# Patient Record
Sex: Female | Born: 1965 | Race: White | Hispanic: No | Marital: Married | Smoking: Current every day smoker
Health system: Southern US, Community
[De-identification: ages and names within clinical notes are randomized; demographics above are authoritative.]

## PROBLEM LIST (undated history)

## (undated) DIAGNOSIS — F319 Bipolar disorder, unspecified: Secondary | ICD-10-CM

## (undated) DIAGNOSIS — F410 Panic disorder [episodic paroxysmal anxiety] without agoraphobia: Secondary | ICD-10-CM

## (undated) DIAGNOSIS — G4700 Insomnia, unspecified: Secondary | ICD-10-CM

## (undated) DIAGNOSIS — K229 Disease of esophagus, unspecified: Secondary | ICD-10-CM

## (undated) DIAGNOSIS — F32A Depression, unspecified: Secondary | ICD-10-CM

## (undated) DIAGNOSIS — F329 Major depressive disorder, single episode, unspecified: Secondary | ICD-10-CM

## (undated) DIAGNOSIS — F4481 Dissociative identity disorder: Secondary | ICD-10-CM

## (undated) DIAGNOSIS — G43909 Migraine, unspecified, not intractable, without status migrainosus: Secondary | ICD-10-CM

## (undated) DIAGNOSIS — I10 Essential (primary) hypertension: Secondary | ICD-10-CM

## (undated) DIAGNOSIS — E079 Disorder of thyroid, unspecified: Secondary | ICD-10-CM

## (undated) DIAGNOSIS — N83209 Unspecified ovarian cyst, unspecified side: Secondary | ICD-10-CM

## (undated) HISTORY — PX: CHOLECYSTECTOMY: SHX55

## (undated) HISTORY — PX: OTHER SURGICAL HISTORY: SHX169

---

## 2016-02-10 ENCOUNTER — Emergency Department (HOSPITAL_COMMUNITY): Payer: Self-pay

## 2016-02-10 ENCOUNTER — Encounter (HOSPITAL_COMMUNITY): Payer: Self-pay | Admitting: Emergency Medicine

## 2016-02-10 ENCOUNTER — Emergency Department (HOSPITAL_COMMUNITY)
Admission: EM | Admit: 2016-02-10 | Discharge: 2016-02-11 | Disposition: A | Discharge status: Home / Self Care | Payer: Self-pay | Attending: Emergency Medicine | Admitting: Emergency Medicine

## 2016-02-10 DIAGNOSIS — K5900 Constipation, unspecified: Secondary | ICD-10-CM | POA: Insufficient documentation

## 2016-02-10 DIAGNOSIS — R110 Nausea: Secondary | ICD-10-CM | POA: Insufficient documentation

## 2016-02-10 DIAGNOSIS — Z79899 Other long term (current) drug therapy: Secondary | ICD-10-CM | POA: Insufficient documentation

## 2016-02-10 DIAGNOSIS — F17210 Nicotine dependence, cigarettes, uncomplicated: Secondary | ICD-10-CM | POA: Insufficient documentation

## 2016-02-10 DIAGNOSIS — R1032 Left lower quadrant pain: Secondary | ICD-10-CM | POA: Insufficient documentation

## 2016-02-10 HISTORY — DX: Unspecified ovarian cyst, unspecified side: N83.209

## 2016-02-10 HISTORY — DX: Disease of esophagus, unspecified: K22.9

## 2016-02-10 LAB — CBC WITH DIFFERENTIAL/PLATELET
BASOS ABS: 0 10*3/uL (ref 0.0–0.1)
Basophils Relative: 0 %
EOS PCT: 3 %
Eosinophils Absolute: 0.2 10*3/uL (ref 0.0–0.7)
HCT: 37.3 % (ref 36.0–46.0)
Hemoglobin: 12.8 g/dL (ref 12.0–15.0)
LYMPHS PCT: 36 %
Lymphs Abs: 2 10*3/uL (ref 0.7–4.0)
MCH: 31.9 pg (ref 26.0–34.0)
MCHC: 34.3 g/dL (ref 30.0–36.0)
MCV: 93 fL (ref 78.0–100.0)
MONO ABS: 0.4 10*3/uL (ref 0.1–1.0)
Monocytes Relative: 8 %
Neutro Abs: 3 10*3/uL (ref 1.7–7.7)
Neutrophils Relative %: 53 %
PLATELETS: 176 10*3/uL (ref 150–400)
RBC: 4.01 MIL/uL (ref 3.87–5.11)
RDW: 13 % (ref 11.5–15.5)
WBC: 5.7 10*3/uL (ref 4.0–10.5)

## 2016-02-10 LAB — COMPREHENSIVE METABOLIC PANEL
ALBUMIN: 3.6 g/dL (ref 3.5–5.0)
ALK PHOS: 47 U/L (ref 38–126)
ALT: 19 U/L (ref 14–54)
AST: 21 U/L (ref 15–41)
Anion gap: 6 (ref 5–15)
BILIRUBIN TOTAL: 0.5 mg/dL (ref 0.3–1.2)
BUN: 7 mg/dL (ref 6–20)
CO2: 28 mmol/L (ref 22–32)
Calcium: 8.7 mg/dL — ABNORMAL LOW (ref 8.9–10.3)
Chloride: 104 mmol/L (ref 101–111)
Creatinine, Ser: 0.59 mg/dL (ref 0.44–1.00)
GFR calc Af Amer: >60 mL/min (ref >60)
GFR calc non Af Amer: >60 mL/min (ref >60)
GLUCOSE: 122 mg/dL — AB (ref 65–99)
Potassium: 3.5 mmol/L (ref 3.5–5.1)
Sodium: 138 mmol/L (ref 135–145)
TOTAL PROTEIN: 6.3 g/dL — AB (ref 6.5–8.1)

## 2016-02-10 LAB — LIPASE, BLOOD: Lipase: 27 U/L (ref 11–51)

## 2016-02-10 LAB — PREGNANCY, URINE: Preg Test, Ur: NEGATIVE

## 2016-02-10 MED ORDER — POLYETHYLENE GLYCOL 3350 17 G PO PACK: 17.0000 g | PACK | Freq: Two times a day (BID) | ORAL | 0 refills | Status: DC

## 2016-02-10 MED ORDER — BISACODYL 10 MG RE SUPP: 10.0000 mg | Freq: Once | RECTAL | Status: DC

## 2016-02-10 NOTE — ED Triage Notes (Signed)
Pt reports intermittent abdominal pain and constipation that has been intermittent for 3 weeks. Pt reports BM yesterday but states it was not normal. Pt also reports LE cramping.

## 2016-02-10 NOTE — Discharge Instructions (Signed)
You were given an enema today. Increase your water intake and fiber (fruits, vegetables, whole wheat). Start using Miralax at least twice daily.

## 2016-02-10 NOTE — ED Provider Notes (Signed)
[facility] Provider Note   CSN: [id number] Arrival date & time: 02/10/16  2011     History   Chief Complaint Chief Complaint  Patient presents with  . Constipation    HPI Hayley Buckley is a 50 y.o. female.  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  Patient notes severe abdominal pain around her rib cage/epigastric region that started 3 weeks ago. Pain is sharp and burning. Pain is getting progressively worse, 8/10 currently. Pain worsened with eating and getting upset.  She intermittently has nausea over the last 2 weeks with smells. No vomiting. She denies nausea today. Over the last 3 weeks, she goes 2-3 days without a BM, this is unusual as she had a cholecystectomy at the age of 50 so she tends to have soft stools.  Her last BM was yesterday and was loose, prior to this it was hard. No blood in the stools. She has not had any flatulence today.  She has hot flashes. Denies fevers or chills.   Also notes cramps in her LE bilaterally for the last 2 days. Notes cramps start in her feet and work their way up into her calves and knees. She used to have issues with her potassium. No injury. No h/o swelling, warmth, or erythema.    Past Medical History:  Diagnosis Date  . Esophageal abnormality   . Ovarian cyst     There are no active problems to display for this patient.   Past Surgical History:  Procedure Laterality Date  . CHOLECYSTECTOMY    . gastrectomy tube      OB History    Gravida Para Term Preterm AB Living             0   SAB TAB Ectopic Multiple Live Births                   Home Medications    Prior to Admission medications   Medication Sig Start Date End Date Taking? Authorizing Provider  acetaminophen (TYLENOL) 500 MG tablet Take 1,000  mg by mouth every 6 (six) hours as needed for mild pain.   Yes Historical Provider, MD  polyethylene glycol (MIRALAX) packet Take 17 g by mouth 2 (two) times daily. 02/10/16   Joanna Puffrystal S Dorsey, MD    Family History Family History  Problem Relation Age of Onset  . Alzheimer's disease Mother   . Diabetes Mother   . Lung disease Father   . Heart attack Brother   . Diabetes Other   . Heart attack Other   . Cancer Other   . ALS Other     Social History Social History  Substance Use Topics  . Smoking status: Current Every Day Smoker    Packs/day: 0.50    Types: Cigarettes  . Smokeless tobacco: Never Used  . Alcohol use No     Allergies   Patient has no known allergies.   Review of Systems Review of Systems  Constitutional: Positive for appetite change. Negative for activity change, diaphoresis, fatigue and fever.  HENT: Negative.   Eyes: Negative.   Respiratory: Negative for cough, shortness of breath, wheezing and stridor.   Cardiovascular: Negative for chest pain and palpitations.  Gastrointestinal: Positive for abdominal pain, constipation and nausea. Negative for abdominal  distention, anal bleeding, blood in stool, diarrhea and vomiting.  Endocrine: Negative.   Genitourinary: Negative for decreased urine volume, difficulty urinating, dysuria, frequency and pelvic pain.  Musculoskeletal: Negative for back pain, gait problem and joint swelling.  Skin: Negative for rash.  Allergic/Immunologic: Negative.   Neurological: Negative.   Hematological: Negative.   Psychiatric/Behavioral: Negative.      Physical Exam Updated Vital Signs BP 134/75 (BP Location: Left Arm)   Pulse 76   Temp 99.2 F (37.3 C) (Oral)   Resp 16   Ht 5\' 4"  (1.626 m)   Wt 75.5 kg   LMP 02/09/2016 Comment: Negative U-preg in ED 02/10/2016  SpO2 99%   BMI 28.58 kg/m   Physical Exam  Constitutional: She appears well-developed and well-nourished. No distress.  HENT:  Head: Normocephalic and  atraumatic.  Nose: Nose normal.  Mouth/Throat: Oropharynx is clear and moist. No oropharyngeal exudate.  Eyes: Conjunctivae are normal. Right eye exhibits no discharge. Left eye exhibits no discharge. No scleral icterus.  Neck: Normal range of motion. Neck supple.  Cardiovascular: Normal rate, regular rhythm, normal heart sounds and intact distal pulses.  Exam reveals no gallop and no friction rub.   No murmur heard. Pulmonary/Chest: Effort normal and breath sounds normal. No respiratory distress. She has no wheezes. She has no rales. She exhibits no tenderness.  Abdominal: Soft. Bowel sounds are normal. She exhibits no distension and no mass. There is tenderness. There is no rebound and no guarding. No hernia.  Surgical scare in the epigastric region. Mild tenderness in the LLQ and the epigastric region. No rebound or guarding.  Musculoskeletal: Normal range of motion. She exhibits no edema or deformity.  Neurological: She is alert. She exhibits normal muscle tone.  Skin: Skin is warm. Capillary refill takes less than 2 seconds. No rash noted. She is not diaphoretic.  Psychiatric: She has a normal mood and affect. Her behavior is normal.     ED Treatments / Results  Labs (all labs ordered are listed, but only abnormal results are displayed) Labs Reviewed  COMPREHENSIVE METABOLIC PANEL - Abnormal; Notable for the following:       Result Value   Glucose, Bld 122 (*)    Calcium 8.7 (*)    Total Protein 6.3 (*)    All other components within normal limits  CBC WITH DIFFERENTIAL/PLATELET  LIPASE, BLOOD  PREGNANCY, URINE    EKG  EKG Interpretation None       Radiology Dg Abdomen 1 View  Result Date: 02/10/2016 CLINICAL DATA:  Constipation and epigastric pain EXAM: ABDOMEN - 1 VIEW COMPARISON:  None. FINDINGS: The bowel gas pattern is normal. No radio-opaque calculi or other significant radiographic abnormality are seen. There is stool seen throughout the colon. Right upper  quadrant cholecystectomy clips. IMPRESSION: No radiographic evidence of obstruction. Electronically Signed   By: Deatra Robinson M.D.   On: 02/10/2016 22:54    Procedures Procedures (including critical care time)  Medications Ordered in ED Medications - No data to display   Initial Impression / Assessment and Plan / ED Course  I have reviewed the triage vital signs and the nursing notes.  Pertinent labs & imaging results that were available during my care of the patient were reviewed by me and considered in my medical decision making (see chart for details).  Clinical Course    Exam consistent with constipation, however given the patient's h/o abdominal surgeries, would like to r/o SBO. Abdominal exam non-surgical.  H/o not consistent with  diverticulitis. Will get CMET, CBC, and lipase. Will also get a KUB.  Labs normal. KUB consistent with constipation. Will give a soap suds enema. Discussed dietary changes at home (increased fluid intake and increased fiber) in addition to Miralax. Return precautions discussed.  Final Clinical Impressions(s) / ED Diagnoses   Final diagnoses:  Constipation, unspecified constipation type    New Prescriptions New Prescriptions   POLYETHYLENE GLYCOL (MIRALAX) PACKET    Take 17 g by mouth 2 (two) times daily.     Joanna Puffrystal S Dorsey, MD 02/10/16 2314    Lavera Guiseana Duo Liu, MD 02/11/16 [phone]

## 2016-02-11 NOTE — ED Notes (Signed)
Pt states that she got relief from the sse and she feels good to go home

## 2016-02-27 ENCOUNTER — Observation Stay (HOSPITAL_COMMUNITY): Payer: Self-pay

## 2016-02-27 ENCOUNTER — Encounter (HOSPITAL_COMMUNITY): Payer: Self-pay

## 2016-02-27 ENCOUNTER — Emergency Department (HOSPITAL_COMMUNITY): Payer: Self-pay

## 2016-02-27 ENCOUNTER — Observation Stay (HOSPITAL_COMMUNITY)
Admission: EM | Admit: 2016-02-27 | Discharge: 2016-02-27 | Disposition: A | Discharge status: Home / Self Care | Payer: Self-pay | Attending: Internal Medicine | Admitting: Internal Medicine

## 2016-02-27 ENCOUNTER — Observation Stay (HOSPITAL_BASED_OUTPATIENT_CLINIC_OR_DEPARTMENT_OTHER): Payer: Self-pay

## 2016-02-27 DIAGNOSIS — R0789 Other chest pain: Principal | ICD-10-CM | POA: Insufficient documentation

## 2016-02-27 DIAGNOSIS — R202 Paresthesia of skin: Secondary | ICD-10-CM

## 2016-02-27 DIAGNOSIS — R200 Anesthesia of skin: Secondary | ICD-10-CM | POA: Insufficient documentation

## 2016-02-27 DIAGNOSIS — R079 Chest pain, unspecified: Secondary | ICD-10-CM | POA: Diagnosis present

## 2016-02-27 DIAGNOSIS — R071 Chest pain on breathing: Secondary | ICD-10-CM

## 2016-02-27 DIAGNOSIS — F17210 Nicotine dependence, cigarettes, uncomplicated: Secondary | ICD-10-CM | POA: Insufficient documentation

## 2016-02-27 LAB — CBC
HCT: 40.3 % (ref 36.0–46.0)
HEMATOCRIT: 36.7 % (ref 36.0–46.0)
HEMOGLOBIN: 12.9 g/dL (ref 12.0–15.0)
Hemoglobin: 13.7 g/dL (ref 12.0–15.0)
MCH: 31.5 pg (ref 26.0–34.0)
MCH: 33 pg (ref 26.0–34.0)
MCHC: 34 g/dL (ref 30.0–36.0)
MCHC: 35.1 g/dL (ref 30.0–36.0)
MCV: 92.6 fL (ref 78.0–100.0)
MCV: 93.9 fL (ref 78.0–100.0)
Platelets: 219 10*3/uL (ref 150–400)
Platelets: 179 10*3/uL (ref 150–400)
RBC: 4.35 MIL/uL (ref 3.87–5.11)
RBC: 3.91 MIL/uL (ref 3.87–5.11)
RDW: 13 % (ref 11.5–15.5)
RDW: 13.2 % (ref 11.5–15.5)
WBC: 7.9 10*3/uL (ref 4.0–10.5)
WBC: 6.7 10*3/uL (ref 4.0–10.5)

## 2016-02-27 LAB — BASIC METABOLIC PANEL
ANION GAP: 5 (ref 5–15)
BUN: 9 mg/dL (ref 6–20)
CALCIUM: 8.7 mg/dL — AB (ref 8.9–10.3)
CO2: 27 mmol/L (ref 22–32)
Chloride: 103 mmol/L (ref 101–111)
Creatinine, Ser: 0.58 mg/dL (ref 0.44–1.00)
GFR calc Af Amer: >60 mL/min (ref >60)
GLUCOSE: 161 mg/dL — AB (ref 65–99)
Potassium: 3.7 mmol/L (ref 3.5–5.1)
SODIUM: 135 mmol/L (ref 135–145)

## 2016-02-27 LAB — POC URINE PREG, ED: Preg Test, Ur: NEGATIVE

## 2016-02-27 LAB — CREATININE, SERUM: CREATININE: 0.61 mg/dL (ref 0.44–1.00)

## 2016-02-27 LAB — I-STAT TROPONIN, ED: TROPONIN I, POC: 0 ng/mL (ref 0.00–0.08)

## 2016-02-27 LAB — TROPONIN I

## 2016-02-27 LAB — BRAIN NATRIURETIC PEPTIDE: B Natriuretic Peptide: 20 pg/mL (ref 0.0–100.0)

## 2016-02-27 MED ORDER — ASPIRIN EC 325 MG PO TBEC
325.0000 mg | DELAYED_RELEASE_TABLET | Freq: Every day | ORAL | Status: DC
  Administered 2016-02-27: 325 mg via ORAL
  Filled 2016-02-27: qty 1

## 2016-02-27 MED ORDER — ONDANSETRON HCL 4 MG/2ML IJ SOLN: 4.0000 mg | Freq: Four times a day (QID) | INTRAMUSCULAR | Status: DC | PRN

## 2016-02-27 MED ORDER — NITROGLYCERIN 0.4 MG SL SUBL
0.4000 mg | SUBLINGUAL_TABLET | SUBLINGUAL | Status: DC | PRN
  Administered 2016-02-27 (×2): 0.4 mg via SUBLINGUAL
  Filled 2016-02-27 (×2): qty 1

## 2016-02-27 MED ORDER — ACETAMINOPHEN 325 MG PO TABS: 650.0000 mg | ORAL_TABLET | ORAL | Status: DC | PRN

## 2016-02-27 MED ORDER — ENOXAPARIN SODIUM 40 MG/0.4ML ~~LOC~~ SOLN
40.0000 mg | SUBCUTANEOUS | Status: DC
  Administered 2016-02-27: 40 mg via SUBCUTANEOUS
  Filled 2016-02-27: qty 0.4

## 2016-02-27 MED ORDER — TRAMADOL HCL 50 MG PO TABS
50.0000 mg | ORAL_TABLET | Freq: Three times a day (TID) | ORAL | Status: DC | PRN
  Filled 2016-02-27: qty 1

## 2016-02-27 MED ORDER — SODIUM CHLORIDE 0.9 % IV BOLUS (SEPSIS)
1000.0000 mL | Freq: Once | INTRAVENOUS | Status: AC
  Administered 2016-02-27: 1000 mL via INTRAVENOUS

## 2016-02-27 MED ORDER — PNEUMOCOCCAL VAC POLYVALENT 25 MCG/0.5ML IJ INJ: 0.5000 mL | INJECTION | INTRAMUSCULAR | Status: DC

## 2016-02-27 MED ORDER — IOPAMIDOL (ISOVUE-370) INJECTION 76%
100.0000 mL | Freq: Once | INTRAVENOUS | Status: AC | PRN
  Administered 2016-02-27: 100 mL via INTRAVENOUS

## 2016-02-27 NOTE — ED Triage Notes (Signed)
Pt in by ems for chest pain onset tonight while lying in the bed.  Pt was given 324 baby aspirin and 1 ntg sl with some relief of pain

## 2016-02-27 NOTE — Progress Notes (Signed)
Pt stated that she wanted to leave AMA. Stated that she was having a lot of stress and that was what was causing her chest pain. Advised patient of risks of leaving AMA and patient verbalized understanding. IV's d/c'd x 2 sites. AMA papers signed by patient.

## 2016-02-27 NOTE — Progress Notes (Signed)
*  PRELIMINARY RESULTS* Echocardiogram 2D Echocardiogram has been performed.  Stacey DrainWhite, Merinda Victorino J 02/27/2016, 4:56 PM

## 2016-02-27 NOTE — ED Notes (Signed)
Patient transported to CT 

## 2016-02-27 NOTE — ED Provider Notes (Signed)
[facility] Provider Note   CSN: [id number] Arrival date & time: 02/27/16  0444     History   Chief Complaint Chief Complaint  Patient presents with  . Chest Pain    HPI Hayley Buckley is a 50 y.o. female.  HPI  50 year old female presents with chest pain that started around 3:30 AM. She states she was already awake and on the couch trying to lay back down when the pain started. It was about an 8 or 9/10 when it started. Patient states that 2 days ago she started having right arm numbness like her arm was asleep. No weakness or dropping objects. About 30 minutes before the chest pain started she then developed left arm numbness. Again no weakness. Now having chest tightness and pressure with inspiration. Wraps around bilaterally to her back. Has also been having a couple days of bilateral leg swelling and pain. No shortness of breath or diaphoresis. Was nauseated when the pain started. The only thing that makes it worse is deep inspiration. Patient was given 324 mg aspirin and one nitroglycerin with partial relief. States she has significant improvement but still rates it as a 6/10. She is concerned about stroke due to the numbness. No neck pain but has been told she has cervical disc disease. Has a moderate frontal headache that started with the chest pain tonight. Has a history of smoking use but denies drug abuse, hypertension, diabetes, or known coronary disease. Multiple family members with coronary disease, earliest onset was a brother with a diagnosis at age 48 or 9259.  Past Medical History:  Diagnosis Date  . Esophageal abnormality   . Ovarian cyst     Patient Active Problem List   Diagnosis Date Noted  . Chest pain 02/27/2016    Past Surgical History:  Procedure Laterality Date  . CHOLECYSTECTOMY    . gastrectomy tube      OB History    Gravida Para Term Preterm AB Living             0   SAB TAB Ectopic Multiple Live Births                   Home  Medications    Prior to Admission medications   Medication Sig Start Date End Date Taking? Authorizing Provider  acetaminophen (TYLENOL) 500 MG tablet Take 1,000 mg by mouth every 6 (six) hours as needed for mild pain.    Historical Provider, MD  polyethylene glycol (MIRALAX) packet Take 17 g by mouth 2 (two) times daily. 02/10/16   Joanna Puffrystal S Dorsey, MD    Family History Family History  Problem Relation Age of Onset  . Alzheimer's disease Mother   . Diabetes Mother   . Lung disease Father   . Heart attack Brother   . Diabetes Other   . Heart attack Other   . Cancer Other   . ALS Other     Social History Social History  Substance Use Topics  . Smoking status: Current Every Day Smoker    Packs/day: 0.50    Types: Cigarettes  . Smokeless tobacco: Never Used  . Alcohol use No     Allergies   Patient has no known allergies.   Review of Systems Review of Systems  Constitutional: Negative for diaphoresis.  Respiratory: Negative for shortness of breath.   Cardiovascular: Positive for chest pain.  Gastrointestinal: Positive for nausea. Negative for abdominal pain and vomiting.  Musculoskeletal: Positive for back pain.  Neurological: Positive  for numbness and headaches. Negative for weakness.  All other systems reviewed and are negative.    Physical Exam Updated Vital Signs BP 134/76   Pulse 74   Temp 98 F (36.7 C) (Oral)   Resp 18   Ht 5\' 4"  (1.626 m)   Wt 168 lb (76.2 kg)   LMP 02/09/2016 Comment: Negative U-preg in ED 02/10/2016  SpO2 97%   BMI 28.84 kg/m   Physical Exam  Constitutional: She is oriented to person, place, and time. She appears well-developed and well-nourished. No distress.  HENT:  Head: Normocephalic and atraumatic.  Right Ear: External ear normal.  Left Ear: External ear normal.  Nose: Nose normal.  Eyes: Right eye exhibits no discharge. Left eye exhibits no discharge.  Neck: Neck supple.  Cardiovascular: Normal rate, regular rhythm  and normal heart sounds.   Pulses:      Radial pulses are 2+ on the right side, and 2+ on the left side.  Pulmonary/Chest: Effort normal and breath sounds normal. She exhibits tenderness (left anterior inferior chest).  Abdominal: Soft. There is no tenderness.  Musculoskeletal: She exhibits no edema or tenderness.  Neurological: She is alert and oriented to person, place, and time.  5/5 strength in all 4 extremities. Decreased subjective sensation in bilateral hands/wrists  Skin: Skin is warm and dry. She is not diaphoretic.  Nursing note and vitals reviewed.    ED Treatments / Results  Labs (all labs ordered are listed, but only abnormal results are displayed) Labs Reviewed  BASIC METABOLIC PANEL - Abnormal; Notable for the following:       Result Value   Glucose, Bld 161 (*)    Calcium 8.7 (*)    All other components within normal limits  CBC  BRAIN NATRIURETIC PEPTIDE  TROPONIN I  TROPONIN I  TROPONIN I  I-STAT TROPOININ, ED  POC URINE PREG, ED    EKG  EKG Interpretation  Date/Time:  Friday February 27 2016 04:53:50 EST Ventricular Rate:  73 PR Interval:    QRS Duration: 84 QT Interval:  400 QTC Calculation: 441 R Axis:   78 Text Interpretation:  Normal sinus rhythm Short PR interval Low voltage, precordial leads no acute ST/T changes No old tracing to compare Confirmed by Joane Postel MD, Moncerrat Burnstein [phone]) on 02/27/2016 4:57:48 AM       Radiology Dg Chest 2 View  Result Date: 02/27/2016 CLINICAL DATA:  Chest pain and tightness. EXAM: CHEST  2 VIEW COMPARISON:  None. FINDINGS: The cardiomediastinal contours are normal. The lungs are clear. Pulmonary vasculature is normal. No consolidation, pleural effusion, or pneumothorax. The sternum is not visualized. No acute osseous abnormalities are seen. There are remote right rib fractures. IMPRESSION: No active cardiopulmonary disease. Electronically Signed   By: Rubye Oaks M.D.   On: 02/27/2016 05:40   Ct Head Wo  Contrast  Result Date: 02/27/2016 CLINICAL DATA:  Posterior headache.  This began earlier today. EXAM: CT HEAD WITHOUT CONTRAST TECHNIQUE: Contiguous axial images were obtained from the base of the skull through the vertex without intravenous contrast. COMPARISON:  None. FINDINGS: Brain: There is asymmetric hypoattenuation of the LEFT temporal lobe which appears on multiple axial slices, reference image 7, 8, 9 series 2. I suspect is artifactual, but I cannot explain why there is such marked asymmetry with regard to the normal-appearing RIGHT temporal lobe. In the setting of acute headache, MRI brain without and with contrast is recommended for further evaluation. No hemorrhage, mass lesion, hydrocephalus, or extra-axial fluid. Vascular:  No hyperdense vessel or unexpected calcification. Skull: Normal. Negative for fracture or focal lesion. Sinuses/Orbits: No acute finding. Other: None. IMPRESSION: Asymmetric hypoattenuation of the LEFT temporal lobe which is probably artifactual, but cannot completely be dismissed in the setting of acute headache. Vascular, inflammatory, or neoplastic considerations could all have this appearance. MRI brain without and with contrast recommended for further evaluation. Electronically Signed   By: Elsie StainJohn T Curnes M.D.   On: 02/27/2016 07:07   Ct Angio Chest/abd/pel For Dissection W And/or Wo Contrast  Result Date: 02/27/2016 CLINICAL DATA:  LEFT anterior chest pain radiates to the back. This began earlier today. EXAM: CT ANGIOGRAPHY CHEST, ABDOMEN AND PELVIS TECHNIQUE: Multidetector CT imaging through the chest, abdomen and pelvis was performed using the standard protocol during bolus administration of intravenous contrast. Multiplanar reconstructed images and MIPs were obtained and reviewed to evaluate the vascular anatomy. CONTRAST:  Isovue 370, 100 mL. COMPARISON:  Chest radiograph 02/27/2016 FINDINGS: CTA CHEST FINDINGS Cardiovascular: Preferential opacification of the  thoracic aorta. No evidence of thoracic aortic aneurysm or dissection. Normal heart size. No pericardial effusion. Mediastinum/Nodes: No enlarged mediastinal, hilar, or axillary lymph nodes. Thyroid gland, trachea, and esophagus demonstrate no significant findings. Lungs/Pleura: Lungs are clear. No pleural effusion or pneumothorax. Musculoskeletal: No chest wall abnormality. No acute or significant osseous findings. Review of the MIP images confirms the above findings. CTA ABDOMEN AND PELVIS FINDINGS VASCULAR Aorta: Normal caliber aorta without aneurysm, dissection, vasculitis or significant stenosis. Celiac: Patent without evidence of aneurysm, dissection, vasculitis or significant stenosis. SMA: Patent without evidence of aneurysm, dissection, vasculitis or significant stenosis. Renals: Both renal arteries are patent without evidence of aneurysm, dissection, vasculitis, fibromuscular dysplasia or significant stenosis. IMA: Patent without evidence of aneurysm, dissection, vasculitis or significant stenosis. Inflow: Normal caliber aorta without aneurysm, dissection, vasculitis or significant stenosis. Veins: No obvious venous abnormality within the limitations of this arterial phase study. Review of the MIP images confirms the above findings. NON-VASCULAR Hepatobiliary: Steatosis.  Cholecystectomy.  No masses. Pancreas: Unremarkable. No pancreatic ductal dilatation or surrounding inflammatory changes. Spleen: Mild splenomegaly is suggested.  Small splenule is noted. Adrenals/Urinary Tract: Adrenal glands are unremarkable. Kidneys are normal, without renal calculi, focal lesion, or hydronephrosis. Bladder is unremarkable. Stomach/Bowel: Stomach is within normal limits. Appendix appears normal. No evidence of bowel wall thickening, distention, or inflammatory changes. Lymphatic: No significant vascular findings are present. No enlarged abdominal or pelvic lymph nodes. Reproductive: Uterus and bilateral adnexa are  unremarkable. Other: No abdominal wall hernia or ascites. Musculoskeletal: Spondylosis.  No worrisome osseous lesion. Review of the MIP images confirms the above findings. IMPRESSION: No evidence for aortic or arterial dissection. No acute findings in the chest or abdomen. Electronically Signed   By: Elsie StainJohn T Curnes M.D.   On: 02/27/2016 07:01    Procedures Procedures (including critical care time)  Medications Ordered in ED Medications  nitroGLYCERIN (NITROSTAT) SL tablet 0.4 mg (0.4 mg Sublingual Given 02/27/16 0737)  sodium chloride 0.9 % bolus 1,000 mL (1,000 mLs Intravenous New Bag/Given 02/27/16 0559)  iopamidol (ISOVUE-370) 76 % injection 100 mL (100 mLs Intravenous Contrast Given 02/27/16 0618)     Initial Impression / Assessment and Plan / ED Course  I have reviewed the triage vital signs and the nursing notes.  Pertinent labs & imaging results that were available during my care of the patient were reviewed by me and considered in my medical decision making (see chart for details).  Clinical Course as of Feb 27 847  Fri Feb 27, 2016  0513 Nitro, labs. CT head, CT chest to rule out dissection given the numbness and back pain. Also consider PE given her reported leg swelling and pleuritic pain. However with the numbness and possibility of severe disease will rule out aorta.  [SG]  [id number] Dr Elisabeth Pigeon to admit, requests MRI and tele obs admission.  [SG]    Clinical Course User Index [SG] Pricilla Loveless, MD    CTA without dissection or PE. CT head with possible artifact vs CVA. Given her numbness started and is more severe on right with left temporal findings, will get MRI. Admit for tele obs for her CP  Final Clinical Impressions(s) / ED Diagnoses   Final diagnoses:  Nonspecific chest pain  Arm numbness    New Prescriptions New Prescriptions   No medications on file     Pricilla Loveless, MD 02/27/16 [phone]

## 2016-02-27 NOTE — H&P (Signed)
History and Physical    Hayley Buckley ZOX:[id number] DOB: March 14, 1965 DOA: 02/27/2016  Referring MD/NP/PA: Dr. Pricilla LovelessGoldston Scott  PCP: No PCP Per Patient   Patient coming from: Home  Chief Complaint: Chest pain and right arm numbness  HPI: Hayley Buckley is a 50 y.o. female with no significant past medical history. She presented with sudden onset midsternal chest pain started around 3:30 a.m. prior to this admission. Pain was initially 8 out of 10 in intensity, non-radiating, present at rest and with movement. Pain was sharp. No specific alleviating or aggravating factors at home but when she was in ER she was given aspirin and 1 nitroglycerin with partial chest pain relief. Her pain was about 6 out of 10 after those medications were given in ED. No associated palpitations. No cough. No lightheadedness. She does report feeling that her legs are swollen especially around ankles. She also reported having left arm numbness followed by right arm numbness over past 2 days prior to this admission but no associated weakness. No slurred speech. No dizziness. No reports of fevers or chills. No reports of abdominal pain, nausea or vomiting.  ED Course: In ED, patient is hemodynamically stable. Her blood work is relatively unremarkable. Troponin level was within normal limits. Chest x-ray with no acute findings. CT chest with no acute cardiopulmonary findings. CT head showed likely and artifact in left temporal lobe but because of patient's report of right arm numbness MRI of the brain was ordered to rule out the possibility of CVA.    Review of Systems:  Constitutional: Negative for fever, chills, diaphoresis, activity change, appetite change and fatigue.  HENT: Negative for ear pain, nosebleeds, congestion, facial swelling, rhinorrhea, neck pain, neck stiffness and ear discharge.   Eyes: Negative for pain, discharge, redness, itching and visual disturbance.  Respiratory: Negative for cough, choking, chest  tightness, shortness of breath, wheezing and stridor.   Cardiovascular: Positive for chest pain but no palpitations. Reports leg swelling. Gastrointestinal: Negative for abdominal distention.  Genitourinary: Negative for dysuria, urgency, frequency, hematuria, flank pain, decreased urine volume, difficulty urinating and dyspareunia.  Musculoskeletal: Negative for back pain, joint swelling, arthralgias and gait problem.  Neurological: Negative for dizziness, tremors, seizures, syncope, facial asymmetry, speech difficulty, weakness, light-headedness. Positive for right arm numbness and headaches.  Hematological: Negative for adenopathy. Does not bruise/bleed easily.  Psychiatric/Behavioral: Negative for hallucinations, behavioral problems, confusion, dysphoric mood, decreased concentration and agitation.   Past Medical History:  Diagnosis Date  . Esophageal abnormality   . Ovarian cyst     Past Surgical History:  Procedure Laterality Date  . CHOLECYSTECTOMY    . gastrectomy tube      Social history:  reports that she has been smoking Cigarettes.  She has been smoking about 0.50 packs per day. She has never used smokeless tobacco. She reports that she does not drink alcohol or use drugs.  Ambulation: Ambulates without assistance at baseline.  No Known Allergies  Family History  Problem Relation Age of Onset  . Alzheimer's disease Mother   . Diabetes Mother   . Lung disease Father   . Heart attack Brother   . Diabetes Other   . Heart attack Other   . Cancer Other   . ALS Other     Prior to Admission medications   Medication Sig Start Date End Date Taking? Authorizing Provider  acetaminophen (TYLENOL) 500 MG tablet Take 1,000 mg by mouth every 6 (six) hours as needed for mild pain.    Historical Provider,  MD  polyethylene glycol (MIRALAX) packet Take 17 g by mouth 2 (two) times daily. 02/10/16   Joanna Puff, MD    Physical Exam: Vitals:   02/27/16 0600 02/27/16 0630  02/27/16 0700 02/27/16 0730  BP: 113/65 135/82 114/68 134/76  Pulse: 72 85 76 74  Resp: 18  17 18   Temp:      TempSrc:      SpO2: 97% 99% 96% 97%  Weight:      Height:        Constitutional: NAD, calm, comfortable Vitals:   02/27/16 0600 02/27/16 0630 02/27/16 0700 02/27/16 0730  BP: 113/65 135/82 114/68 134/76  Pulse: 72 85 76 74  Resp: 18  17 18   Temp:      TempSrc:      SpO2: 97% 99% 96% 97%  Weight:      Height:       Eyes: PERRL, lids and conjunctivae normal ENMT: Mucous membranes are moist. Posterior pharynx clear of any exudate or lesions.Normal dentition.  Neck: normal, supple, no masses, no thyromegaly Respiratory: clear to auscultation bilaterally, no wheezing, no crackles. Normal respiratory effort. No accessory muscle use.  Cardiovascular: Regular rate and rhythm, no murmurs / rubs / gallops. No extremity edema. 2+ pedal pulses. No carotid bruits.  Abdomen: no tenderness, no masses palpated. No hepatosplenomegaly. Bowel sounds positive.  Musculoskeletal: no clubbing / cyanosis. No joint deformity upper and lower extremities. Good ROM, no contractures. Normal muscle tone.  Skin: no rashes, lesions, ulcers. No induration Neurologic: CN 2-12 grossly intact. Sensation intact, DTR normal. Strength 5/5 in all 4.  Psychiatric: Normal judgment and insight. Alert and oriented x 3. Normal mood.    Labs on Admission: I have personally reviewed following labs and imaging studies  CBC:  Recent Labs Lab 02/27/16 0512  WBC 7.9  HGB 13.7  HCT 40.3  MCV 92.6  PLT 219   Basic Metabolic Panel:  Recent Labs Lab 02/27/16 0512  NA 135  K 3.7  CL 103  CO2 27  GLUCOSE 161*  BUN 9  CREATININE 0.58  CALCIUM 8.7*   GFR: Estimated Creatinine Clearance: 85.9 mL/min (by C-G formula based on SCr of 0.58 mg/dL). Liver Function Tests: No results for input(s): AST, ALT, ALKPHOS, BILITOT, PROT, ALBUMIN in the last 168 hours. No results for input(s): LIPASE, AMYLASE in the  last 168 hours. No results for input(s): AMMONIA in the last 168 hours. Coagulation Profile: No results for input(s): INR, PROTIME in the last 168 hours. Cardiac Enzymes: No results for input(s): CKTOTAL, CKMB, CKMBINDEX, TROPONINI in the last 168 hours. BNP (last 3 results) No results for input(s): PROBNP in the last 8760 hours. HbA1C: No results for input(s): HGBA1C in the last 72 hours. CBG: No results for input(s): GLUCAP in the last 168 hours. Lipid Profile: No results for input(s): CHOL, HDL, LDLCALC, TRIG, CHOLHDL, LDLDIRECT in the last 72 hours. Thyroid Function Tests: No results for input(s): TSH, T4TOTAL, FREET4, T3FREE, THYROIDAB in the last 72 hours. Anemia Panel: No results for input(s): VITAMINB12, FOLATE, FERRITIN, TIBC, IRON, RETICCTPCT in the last 72 hours. Urine analysis: No results found for: COLORURINE, APPEARANCEUR, LABSPEC, PHURINE, GLUCOSEU, HGBUR, BILIRUBINUR, KETONESUR, PROTEINUR, UROBILINOGEN, NITRITE, LEUKOCYTESUR Sepsis Labs: @LABRCNTIP (procalcitonin:4,lacticidven:4) )No results found for this or any previous visit (from the past 240 hour(s)).   Radiological Exams on Admission: Dg Chest 2 View  Result Date: 02/27/2016 CLINICAL DATA:  Chest pain and tightness. EXAM: CHEST  2 VIEW COMPARISON:  None. FINDINGS: The cardiomediastinal contours  are normal. The lungs are clear. Pulmonary vasculature is normal. No consolidation, pleural effusion, or pneumothorax. The sternum is not visualized. No acute osseous abnormalities are seen. There are remote right rib fractures. IMPRESSION: No active cardiopulmonary disease. Electronically Signed   By: Rubye Oaks M.D.   On: 02/27/2016 05:40   Ct Head Wo Contrast  Result Date: 02/27/2016 CLINICAL DATA:  Posterior headache.  This began earlier today. EXAM: CT HEAD WITHOUT CONTRAST TECHNIQUE: Contiguous axial images were obtained from the base of the skull through the vertex without intravenous contrast. COMPARISON:   None. FINDINGS: Brain: There is asymmetric hypoattenuation of the LEFT temporal lobe which appears on multiple axial slices, reference image 7, 8, 9 series 2. I suspect is artifactual, but I cannot explain why there is such marked asymmetry with regard to the normal-appearing RIGHT temporal lobe. In the setting of acute headache, MRI brain without and with contrast is recommended for further evaluation. No hemorrhage, mass lesion, hydrocephalus, or extra-axial fluid. Vascular: No hyperdense vessel or unexpected calcification. Skull: Normal. Negative for fracture or focal lesion. Sinuses/Orbits: No acute finding. Other: None. IMPRESSION: Asymmetric hypoattenuation of the LEFT temporal lobe which is probably artifactual, but cannot completely be dismissed in the setting of acute headache. Vascular, inflammatory, or neoplastic considerations could all have this appearance. MRI brain without and with contrast recommended for further evaluation. Electronically Signed   By: Elsie Stain M.D.   On: 02/27/2016 07:07   Ct Angio Chest/abd/pel For Dissection W And/or Wo Contrast  Result Date: 02/27/2016 CLINICAL DATA:  LEFT anterior chest pain radiates to the back. This began earlier today. EXAM: CT ANGIOGRAPHY CHEST, ABDOMEN AND PELVIS TECHNIQUE: Multidetector CT imaging through the chest, abdomen and pelvis was performed using the standard protocol during bolus administration of intravenous contrast. Multiplanar reconstructed images and MIPs were obtained and reviewed to evaluate the vascular anatomy. CONTRAST:  Isovue 370, 100 mL. COMPARISON:  Chest radiograph 02/27/2016 FINDINGS: CTA CHEST FINDINGS Cardiovascular: Preferential opacification of the thoracic aorta. No evidence of thoracic aortic aneurysm or dissection. Normal heart size. No pericardial effusion. Mediastinum/Nodes: No enlarged mediastinal, hilar, or axillary lymph nodes. Thyroid gland, trachea, and esophagus demonstrate no significant findings.  Lungs/Pleura: Lungs are clear. No pleural effusion or pneumothorax. Musculoskeletal: No chest wall abnormality. No acute or significant osseous findings. Review of the MIP images confirms the above findings. CTA ABDOMEN AND PELVIS FINDINGS VASCULAR Aorta: Normal caliber aorta without aneurysm, dissection, vasculitis or significant stenosis. Celiac: Patent without evidence of aneurysm, dissection, vasculitis or significant stenosis. SMA: Patent without evidence of aneurysm, dissection, vasculitis or significant stenosis. Renals: Both renal arteries are patent without evidence of aneurysm, dissection, vasculitis, fibromuscular dysplasia or significant stenosis. IMA: Patent without evidence of aneurysm, dissection, vasculitis or significant stenosis. Inflow: Normal caliber aorta without aneurysm, dissection, vasculitis or significant stenosis. Veins: No obvious venous abnormality within the limitations of this arterial phase study. Review of the MIP images confirms the above findings. NON-VASCULAR Hepatobiliary: Steatosis.  Cholecystectomy.  No masses. Pancreas: Unremarkable. No pancreatic ductal dilatation or surrounding inflammatory changes. Spleen: Mild splenomegaly is suggested.  Small splenule is noted. Adrenals/Urinary Tract: Adrenal glands are unremarkable. Kidneys are normal, without renal calculi, focal lesion, or hydronephrosis. Bladder is unremarkable. Stomach/Bowel: Stomach is within normal limits. Appendix appears normal. No evidence of bowel wall thickening, distention, or inflammatory changes. Lymphatic: No significant vascular findings are present. No enlarged abdominal or pelvic lymph nodes. Reproductive: Uterus and bilateral adnexa are unremarkable. Other: No abdominal wall hernia or ascites. Musculoskeletal:  Spondylosis.  No worrisome osseous lesion. Review of the MIP images confirms the above findings. IMPRESSION: No evidence for aortic or arterial dissection. No acute findings in the chest or  abdomen. Electronically Signed   By: Elsie StainJohn T Curnes M.D.   On: 02/27/2016 07:01    EKG: Independently reviewed. Sinus rhythm.  Assessment/Plan  Active Problems:   Chest pain  - Rule out ACS - The 12-lead EKG showed sinus rhythm, no acute ischemic changes - The first troponin was within normal limits - No acute findings on CT chest - Cycle cardiac enzymes - Obtain 2-D echo    Right arm numbness - Present for last 2 days - No associated right-sided weakness - MRI brain pending   DVT prophylaxis: Lovenox subcutaneous Code Status: Full code  Family Communication: No family at the bedside Disposition Plan: Admission to telemetry unit Consults called: None Admission status:  Observation. Patient presented with chest pain and will require admission for ruling out acute coronary syndrome. Her 12-lead EKG showed sinus rhythm without acute ischemic changes. First troponin level is within normal limits. She will require cycling cardiac enzymes for total of 3 sets in addition to 2-D echo. She also reported right arm numbness and there was an artifact seen on CT head so order was placed for MRI brain. She will most likely require 24-hour observation in order to complete the cardiac enzymes and 2-D echo and obtain MRI results.  Manson PasseyEVINE, ALMA MD [facility] Pager [phone]  If 7PM-7AM, please contact night-coverage [email or web address] Password [email or web address]  02/27/2016, 8:21 AM

## 2016-02-28 LAB — ECHOCARDIOGRAM COMPLETE
AVLVOTPG: 7 mmHg
CHL CUP DOP CALC LVOT VTI: 28.3 cm
CHL CUP MV DEC (S): 264
CHL CUP STROKE VOLUME: 38 mL
CHL CUP TV REG PEAK VELOCITY: 210 cm/s
EERAT: 6.32
EWDT: 264 ms
FS: 43 % (ref 28–44)
Height: 64 in
IV/PV OW: 1.03
LA ID, A-P, ES: 29 mm
LA diam index: 1.55 cm/m2
LA vol index: 21.4 mL/m2
LA vol: 40 mL
LAVOLA4C: 27.8 mL
LDCA: 2.84 cm2
LEFT ATRIUM END SYS DIAM: 29 mm
LV E/e' medial: 6.32
LV E/e'average: 6.32
LV PW d: 10.4 mm — AB (ref 0.6–1.1)
LV TDI E'LATERAL: 12.2
LV TDI E'MEDIAL: 9.14
LV dias vol index: 30 mL/m2
LV e' LATERAL: 12.2 cm/s
LVDIAVOL: 56 mL (ref 46–106)
LVOT SV: 80 mL
LVOT peak vel: 135 cm/s
LVOTD: 19 mm
LVSYSVOL: 18 mL (ref 14–42)
LVSYSVOLIN: 10 mL/m2
MV Peak grad: 2 mmHg
MV pk E vel: 77.1 m/s
MVPKAVEL: 80.9 m/s
RV LATERAL S' VELOCITY: 15.4 cm/s
RV sys press: 21 mmHg
Simpson's disk: 68
TAPSE: 19.1 mm
TRMAXVEL: 210 cm/s
Weight: 2656 oz

## 2016-03-23 ENCOUNTER — Encounter (HOSPITAL_COMMUNITY): Payer: Self-pay

## 2016-03-23 ENCOUNTER — Emergency Department (HOSPITAL_COMMUNITY)
Admission: EM | Admit: 2016-03-23 | Discharge: 2016-03-23 | Disposition: A | Discharge status: Home / Self Care | Payer: Self-pay | Attending: Emergency Medicine | Admitting: Emergency Medicine

## 2016-03-23 DIAGNOSIS — Y9302 Activity, running: Secondary | ICD-10-CM | POA: Insufficient documentation

## 2016-03-23 DIAGNOSIS — T148XXA Other injury of unspecified body region, initial encounter: Secondary | ICD-10-CM

## 2016-03-23 DIAGNOSIS — R05 Cough: Secondary | ICD-10-CM | POA: Insufficient documentation

## 2016-03-23 DIAGNOSIS — S50312A Abrasion of left elbow, initial encounter: Secondary | ICD-10-CM | POA: Insufficient documentation

## 2016-03-23 DIAGNOSIS — F17210 Nicotine dependence, cigarettes, uncomplicated: Secondary | ICD-10-CM | POA: Insufficient documentation

## 2016-03-23 DIAGNOSIS — Y929 Unspecified place or not applicable: Secondary | ICD-10-CM | POA: Insufficient documentation

## 2016-03-23 DIAGNOSIS — Z7982 Long term (current) use of aspirin: Secondary | ICD-10-CM | POA: Insufficient documentation

## 2016-03-23 DIAGNOSIS — W19XXXA Unspecified fall, initial encounter: Secondary | ICD-10-CM

## 2016-03-23 DIAGNOSIS — W010XXA Fall on same level from slipping, tripping and stumbling without subsequent striking against object, initial encounter: Secondary | ICD-10-CM | POA: Insufficient documentation

## 2016-03-23 DIAGNOSIS — Y999 Unspecified external cause status: Secondary | ICD-10-CM | POA: Insufficient documentation

## 2016-03-23 DIAGNOSIS — Z79899 Other long term (current) drug therapy: Secondary | ICD-10-CM | POA: Insufficient documentation

## 2016-03-23 DIAGNOSIS — N6459 Other signs and symptoms in breast: Secondary | ICD-10-CM | POA: Insufficient documentation

## 2016-03-23 DIAGNOSIS — R110 Nausea: Secondary | ICD-10-CM | POA: Insufficient documentation

## 2016-03-23 HISTORY — DX: Disorder of thyroid, unspecified: E07.9

## 2016-03-23 HISTORY — DX: Migraine, unspecified, not intractable, without status migrainosus: G43.909

## 2016-03-23 HISTORY — DX: Panic disorder (episodic paroxysmal anxiety): F41.0

## 2016-03-23 LAB — CBG MONITORING, ED: GLUCOSE-CAPILLARY: 120 mg/dL — AB (ref 65–99)

## 2016-03-23 LAB — RAPID STREP SCREEN (MED CTR MEBANE ONLY): STREPTOCOCCUS, GROUP A SCREEN (DIRECT): NEGATIVE
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
## 2016-03-25 LAB — CULTURE, GROUP A STREP (THRC)

## 2016-03-29 ENCOUNTER — Emergency Department (HOSPITAL_COMMUNITY): Payer: Self-pay

## 2016-03-29 ENCOUNTER — Encounter (HOSPITAL_COMMUNITY): Payer: Self-pay | Admitting: *Deleted

## 2016-03-29 ENCOUNTER — Emergency Department (HOSPITAL_COMMUNITY)
Admission: EM | Admit: 2016-03-29 | Discharge: 2016-03-29 | Disposition: A | Discharge status: Home / Self Care | Payer: Self-pay | Attending: Emergency Medicine | Admitting: Emergency Medicine

## 2016-03-29 DIAGNOSIS — S7001XA Contusion of right hip, initial encounter: Secondary | ICD-10-CM | POA: Insufficient documentation

## 2016-03-29 DIAGNOSIS — W19XXXA Unspecified fall, initial encounter: Secondary | ICD-10-CM

## 2016-03-29 DIAGNOSIS — Y939 Activity, unspecified: Secondary | ICD-10-CM | POA: Insufficient documentation

## 2016-03-29 DIAGNOSIS — Y999 Unspecified external cause status: Secondary | ICD-10-CM | POA: Insufficient documentation

## 2016-03-29 DIAGNOSIS — F17210 Nicotine dependence, cigarettes, uncomplicated: Secondary | ICD-10-CM | POA: Insufficient documentation

## 2016-03-29 DIAGNOSIS — W1839XA Other fall on same level, initial encounter: Secondary | ICD-10-CM | POA: Insufficient documentation

## 2016-03-29 DIAGNOSIS — S161XXA Strain of muscle, fascia and tendon at neck level, initial encounter: Secondary | ICD-10-CM | POA: Insufficient documentation

## 2016-03-29 DIAGNOSIS — R300 Dysuria: Secondary | ICD-10-CM | POA: Insufficient documentation

## 2016-03-29 DIAGNOSIS — Y929 Unspecified place or not applicable: Secondary | ICD-10-CM | POA: Insufficient documentation

## 2016-03-29 LAB — I-STAT BETA HCG BLOOD, ED (MC, WL, AP ONLY): I-stat hCG, quantitative: <5 m[IU]/mL (ref <5)

## 2016-03-29 LAB — POC URINE PREG, ED: Preg Test, Ur: NEGATIVE

## 2016-03-29 MED ORDER — NAPROXEN 500 MG PO TABS: 500.0000 mg | ORAL_TABLET | Freq: Two times a day (BID) | ORAL | 0 refills | Status: DC

## 2016-03-29 NOTE — ED Triage Notes (Signed)
Pt was seen here for a fall last week. Pt states she has had neck pain since then and needs pain medication.
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
## 2016-03-29 NOTE — Discharge Instructions (Signed)
Try alternate ice and heat to your hip and neck.  Call one of the primary provider listed to establish primary care.

## 2016-03-29 NOTE — ED Notes (Signed)
Xray went into room to take patient over for xray and she states that she would like a blood hcg test to be sure she isn't pregnant.

## 2016-04-17 ENCOUNTER — Encounter (HOSPITAL_COMMUNITY): Payer: Self-pay | Admitting: *Deleted

## 2016-04-17 ENCOUNTER — Emergency Department (HOSPITAL_COMMUNITY)
Admission: EM | Admit: 2016-04-17 | Discharge: 2016-04-18 | Disposition: A | Discharge status: Home / Self Care | Payer: Self-pay | Attending: Emergency Medicine | Admitting: Emergency Medicine

## 2016-04-17 DIAGNOSIS — F17210 Nicotine dependence, cigarettes, uncomplicated: Secondary | ICD-10-CM | POA: Insufficient documentation

## 2016-04-17 DIAGNOSIS — T192XXA Foreign body in vulva and vagina, initial encounter: Secondary | ICD-10-CM | POA: Insufficient documentation

## 2016-04-17 DIAGNOSIS — Z79899 Other long term (current) drug therapy: Secondary | ICD-10-CM | POA: Insufficient documentation

## 2016-04-17 DIAGNOSIS — Y929 Unspecified place or not applicable: Secondary | ICD-10-CM | POA: Insufficient documentation

## 2016-04-17 DIAGNOSIS — Y939 Activity, unspecified: Secondary | ICD-10-CM | POA: Insufficient documentation

## 2016-04-17 DIAGNOSIS — W228XXA Striking against or struck by other objects, initial encounter: Secondary | ICD-10-CM | POA: Insufficient documentation

## 2016-04-17 DIAGNOSIS — Y999 Unspecified external cause status: Secondary | ICD-10-CM | POA: Insufficient documentation

## 2016-04-17 MED ORDER — LIDOCAINE HCL 2 % EX GEL
CUTANEOUS | Status: AC
  Administered 2016-04-17: 23:00:00
  Filled 2016-04-17: qty 10

## 2016-04-17 NOTE — ED Triage Notes (Addendum)
Pt states she has a "cardinal salt shaker stuck in her vagina.

## 2016-04-17 NOTE — ED Provider Notes (Signed)
[facility] Provider Note   CSN: [id number] Arrival date & time: 04/17/16  2120     History   Chief Complaint Chief Complaint  Patient presents with  . Foreign Body in Vagina    HPI Hayley Buckley is a 51 y.o. female presenting for assistance with foreign body removal.  She placed a ceramic salt shaker which is in the shape of a cardinal bird in her vagina while masturbating an hour before arriving here and has been unable to remove it secondary to severe pain when pressure is applied.  She denies vaginal bleeding and any other complaints.  The history is provided by the patient.    Past Medical History:  Diagnosis Date  . Esophageal abnormality   . Migraine   . Ovarian cyst   . Panic attack   . Thyroid disease     Patient Active Problem List   Diagnosis Date Noted  . Chest pain 02/27/2016    Past Surgical History:  Procedure Laterality Date  . CHOLECYSTECTOMY    . gastrectomy tube      OB History    Gravida Para Term Preterm AB Living             0   SAB TAB Ectopic Multiple Live Births                   Home Medications    Prior to Admission medications   Medication Sig Start Date End Date Taking? Authorizing Provider  acetaminophen (TYLENOL) 500 MG tablet Take 500 mg by mouth every 8 (eight) hours as needed for mild pain.   Yes Historical Provider, MD  naproxen sodium (ALEVE) 220 MG tablet Take 220 mg by mouth daily as needed (for pain).   Yes Historical Provider, MD  polyethylene glycol (MIRALAX / GLYCOLAX) packet Take 8.5-17 g by mouth 2 (two) times daily as needed for mild constipation.   Yes Historical Provider, MD  naproxen (NAPROSYN) 500 MG tablet Take 1 tablet (500 mg total) by mouth 2 (two) times daily with a meal. Patient not taking: Reported on 04/17/2016 03/29/16   Pauline Ausammy Triplett, PA-C    Family History Family History  Problem Relation Age of Onset  . Alzheimer's disease Mother   . Diabetes Mother   . Lung disease Father   . Heart  attack Brother   . Diabetes Other   . Heart attack Other   . Cancer Other   . ALS Other     Social History Social History  Substance Use Topics  . Smoking status: Current Every Day Smoker    Packs/day: 0.50    Types: Cigarettes  . Smokeless tobacco: Never Used  . Alcohol use Yes     Comment: social     Allergies   Patient has no known allergies.   Review of Systems Review of Systems  Constitutional: Negative for fever.  Eyes: Negative.   Cardiovascular: Negative.   Gastrointestinal: Negative for abdominal pain and nausea.  Genitourinary: Positive for vaginal pain. Negative for vaginal bleeding.  Musculoskeletal: Negative for arthralgias, joint swelling and neck pain.  Skin: Negative.  Negative for rash and wound.  Neurological: Negative.   Psychiatric/Behavioral: Negative.      Physical Exam Updated Vital Signs BP 121/69 (BP Location: Right Arm)   Pulse 72   Temp 98.1 F (36.7 C) (Oral)   Resp 20   Ht 5\' 4"  (1.626 m)   Wt 77.1 kg   LMP 03/10/2016   SpO2 97%  BMI 29.18 kg/m   Physical Exam  Constitutional: She appears well-developed and well-nourished.  HENT:  Head: Normocephalic and atraumatic.  Eyes: Conjunctivae are normal.  Cardiovascular: Normal rate.   Pulmonary/Chest: Effort normal.  Abdominal: Soft. There is no tenderness.  Genitourinary:  Genitourinary Comments: Visible ceramic foreign body extending from the vaginal introitus.  Musculoskeletal: Normal range of motion.  Neurological: She is alert.  Skin: Skin is warm and dry.  Psychiatric: She has a normal mood and affect.  Nursing note and vitals reviewed.    ED Treatments / Results  Labs (all labs ordered are listed, but only abnormal results are displayed) Labs Reviewed - No data to display  EKG  EKG Interpretation None       Radiology No results found.  Procedures .Foreign Body Removal Date/Time: 04/17/2016 11:56 PM Performed by: Burgess Amor Authorized by: Burgess Amor   Consent: Verbal consent obtained. Risks and benefits: risks, benefits and alternatives were discussed Consent given by: patient Patient understanding: patient states understanding of the procedure being performed Time out: Immediately prior to procedure a "time out" was called to verify the correct patient, procedure, equipment, support staff and site/side marked as required. Body area: vagina  Anesthesia: Local anesthetic: lidocaine gel. Localization method: visualized Removal mechanism: digital extraction Complexity: simple 1 objects recovered. Objects recovered: ceramic salt shaker, cardinal bird shaped Post-procedure assessment: foreign body removed Patient tolerance: Patient tolerated the procedure well with no immediate complications Comments: Pointed portion of the bird was hooked on vaginal wall making simple extraction painful.  The figurine was pushed further in, then forceps was used to "unhook" the object.  It then simple slid out.  No bleeding, no vaginal wall lacerations noted.     (including critical care time)  Medications Ordered in ED Medications  lidocaine (XYLOCAINE) 2 % jelly (  Given by Other 04/17/16 2310)     Initial Impression / Assessment and Plan / ED Course  I have reviewed the triage vital signs and the nursing notes.  Pertinent labs & imaging results that were available during my care of the patient were reviewed by me and considered in my medical decision making (see chart for details).     Prn f/u anticipated.  Final Clinical Impressions(s) / ED Diagnoses   Final diagnoses:  Foreign body in vagina, initial encounter    New Prescriptions New Prescriptions   No medications on file     Burgess Amor, PA-C 04/17/16 2359    Linwood Dibbles, MD 04/18/16 [phone]

## 2016-04-18 NOTE — ED Notes (Signed)
Pt alert & oriented x4, stable gait. Patient given discharge instructions, paperwork & prescription(s). Patient  instructed to stop at the registration desk to finish any additional paperwork. Patient verbalized understanding. Pt left department w/ no further questions. 

## 2016-04-19 ENCOUNTER — Encounter (HOSPITAL_COMMUNITY): Payer: Self-pay | Admitting: Emergency Medicine

## 2016-04-19 ENCOUNTER — Emergency Department (HOSPITAL_COMMUNITY)
Admission: EM | Admit: 2016-04-19 | Discharge: 2016-04-20 | Disposition: A | Discharge status: Other Acute Inpatient Hospital | Payer: Self-pay | Attending: Emergency Medicine | Admitting: Emergency Medicine

## 2016-04-19 DIAGNOSIS — F319 Bipolar disorder, unspecified: Secondary | ICD-10-CM | POA: Insufficient documentation

## 2016-04-19 DIAGNOSIS — R45850 Homicidal ideations: Secondary | ICD-10-CM

## 2016-04-19 DIAGNOSIS — Z5181 Encounter for therapeutic drug level monitoring: Secondary | ICD-10-CM | POA: Insufficient documentation

## 2016-04-19 DIAGNOSIS — R45851 Suicidal ideations: Secondary | ICD-10-CM

## 2016-04-19 DIAGNOSIS — Z791 Long term (current) use of non-steroidal anti-inflammatories (NSAID): Secondary | ICD-10-CM | POA: Insufficient documentation

## 2016-04-19 DIAGNOSIS — F17210 Nicotine dependence, cigarettes, uncomplicated: Secondary | ICD-10-CM | POA: Insufficient documentation

## 2016-04-19 DIAGNOSIS — F4310 Post-traumatic stress disorder, unspecified: Secondary | ICD-10-CM | POA: Insufficient documentation

## 2016-04-19 LAB — CBC WITH DIFFERENTIAL/PLATELET
Basophils Absolute: 0 10*3/uL (ref 0.0–0.1)
Basophils Relative: 0 %
EOS ABS: 0.2 10*3/uL (ref 0.0–0.7)
Eosinophils Relative: 2 %
HEMATOCRIT: 38.5 % (ref 36.0–46.0)
HEMOGLOBIN: 13.6 g/dL (ref 12.0–15.0)
LYMPHS ABS: 2 10*3/uL (ref 0.7–4.0)
Lymphocytes Relative: 21 %
MCH: 32.4 pg (ref 26.0–34.0)
MCHC: 35.3 g/dL (ref 30.0–36.0)
MCV: 91.7 fL (ref 78.0–100.0)
MONOS PCT: 5 %
Monocytes Absolute: 0.5 10*3/uL (ref 0.1–1.0)
NEUTROS PCT: 72 %
Neutro Abs: 6.8 10*3/uL (ref 1.7–7.7)
Platelets: 210 10*3/uL (ref 150–400)
RBC: 4.2 MIL/uL (ref 3.87–5.11)
RDW: 13.2 % (ref 11.5–15.5)
WBC: 9.5 10*3/uL (ref 4.0–10.5)

## 2016-04-19 LAB — COMPREHENSIVE METABOLIC PANEL
ALK PHOS: 48 U/L (ref 38–126)
ALT: 15 U/L (ref 14–54)
ANION GAP: 7 (ref 5–15)
AST: 18 U/L (ref 15–41)
Albumin: 4.1 g/dL (ref 3.5–5.0)
BILIRUBIN TOTAL: 0.7 mg/dL (ref 0.3–1.2)
BUN: 8 mg/dL (ref 6–20)
CALCIUM: 8.8 mg/dL — AB (ref 8.9–10.3)
CO2: 28 mmol/L (ref 22–32)
Chloride: 102 mmol/L (ref 101–111)
Creatinine, Ser: 0.61 mg/dL (ref 0.44–1.00)
GFR calc Af Amer: >60 mL/min (ref >60)
Glucose, Bld: 141 mg/dL — ABNORMAL HIGH (ref 65–99)
Potassium: 3.6 mmol/L (ref 3.5–5.1)
Sodium: 137 mmol/L (ref 135–145)
TOTAL PROTEIN: 7 g/dL (ref 6.5–8.1)

## 2016-04-19 LAB — ACETAMINOPHEN LEVEL

## 2016-04-19 LAB — I-STAT BETA HCG BLOOD, ED (MC, WL, AP ONLY): I-stat hCG, quantitative: <5 m[IU]/mL (ref <5)

## 2016-04-19 LAB — ETHANOL: Alcohol, Ethyl (B): <5 mg/dL (ref <5)

## 2016-04-19 LAB — SALICYLATE LEVEL

## 2016-04-19 MED ORDER — LORAZEPAM 1 MG PO TABS: 1.0000 mg | ORAL_TABLET | Freq: Three times a day (TID) | ORAL | Status: DC | PRN

## 2016-04-19 MED ORDER — IBUPROFEN 400 MG PO TABS: 600.0000 mg | ORAL_TABLET | Freq: Three times a day (TID) | ORAL | Status: DC | PRN

## 2016-04-19 MED ORDER — ONDANSETRON HCL 4 MG PO TABS: 4.0000 mg | ORAL_TABLET | Freq: Three times a day (TID) | ORAL | Status: DC | PRN

## 2016-04-19 MED ORDER — ACETAMINOPHEN 325 MG PO TABS
650.0000 mg | ORAL_TABLET | ORAL | Status: DC | PRN
  Administered 2016-04-20: 650 mg via ORAL
  Filled 2016-04-19: qty 2
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
## 2016-04-20 ENCOUNTER — Encounter (HOSPITAL_COMMUNITY): Payer: Self-pay | Admitting: Behavioral Health

## 2016-04-20 ENCOUNTER — Inpatient Hospital Stay (HOSPITAL_COMMUNITY)
Admission: AD | Admit: 2016-04-20 | Discharge: 2016-04-24 | DRG: 885 | Disposition: A | Discharge status: Home / Self Care | Payer: No Typology Code available for payment source | Source: Intra-hospital | Attending: Psychiatry | Admitting: Psychiatry

## 2016-04-20 DIAGNOSIS — Z9114 Patient's other noncompliance with medication regimen: Secondary | ICD-10-CM

## 2016-04-20 DIAGNOSIS — Z833 Family history of diabetes mellitus: Secondary | ICD-10-CM

## 2016-04-20 DIAGNOSIS — F3164 Bipolar disorder, current episode mixed, severe, with psychotic features: Secondary | ICD-10-CM | POA: Diagnosis present

## 2016-04-20 DIAGNOSIS — F17210 Nicotine dependence, cigarettes, uncomplicated: Secondary | ICD-10-CM | POA: Diagnosis present

## 2016-04-20 DIAGNOSIS — Z8249 Family history of ischemic heart disease and other diseases of the circulatory system: Secondary | ICD-10-CM | POA: Diagnosis not present

## 2016-04-20 DIAGNOSIS — Z818 Family history of other mental and behavioral disorders: Secondary | ICD-10-CM

## 2016-04-20 DIAGNOSIS — F1220 Cannabis dependence, uncomplicated: Secondary | ICD-10-CM | POA: Diagnosis not present

## 2016-04-20 DIAGNOSIS — Z23 Encounter for immunization: Secondary | ICD-10-CM | POA: Diagnosis not present

## 2016-04-20 DIAGNOSIS — Z820 Family history of epilepsy and other diseases of the nervous system: Secondary | ICD-10-CM | POA: Diagnosis not present

## 2016-04-20 DIAGNOSIS — Z79899 Other long term (current) drug therapy: Secondary | ICD-10-CM | POA: Diagnosis not present

## 2016-04-20 DIAGNOSIS — Z810 Family history of intellectual disabilities: Secondary | ICD-10-CM | POA: Diagnosis not present

## 2016-04-20 DIAGNOSIS — R45851 Suicidal ideations: Secondary | ICD-10-CM | POA: Diagnosis present

## 2016-04-20 DIAGNOSIS — F319 Bipolar disorder, unspecified: Secondary | ICD-10-CM | POA: Diagnosis present

## 2016-04-20 DIAGNOSIS — R45850 Homicidal ideations: Secondary | ICD-10-CM | POA: Diagnosis present

## 2016-04-20 DIAGNOSIS — R0981 Nasal congestion: Secondary | ICD-10-CM | POA: Diagnosis present

## 2016-04-20 LAB — RAPID URINE DRUG SCREEN, HOSP PERFORMED
AMPHETAMINES: NOT DETECTED
BENZODIAZEPINES: NOT DETECTED
Barbiturates: NOT DETECTED
Cocaine: NOT DETECTED
OPIATES: NOT DETECTED
Tetrahydrocannabinol: NOT DETECTED

## 2016-04-20 LAB — CBG MONITORING, ED: Glucose-Capillary: 120 mg/dL — ABNORMAL HIGH (ref 65–99)

## 2016-04-20 MED ORDER — MAGNESIUM HYDROXIDE 400 MG/5ML PO SUSP
30.0000 mL | Freq: Every day | ORAL | Status: DC | PRN
  Administered 2016-04-21 – 2016-04-22 (×2): 30 mL via ORAL
  Filled 2016-04-20 (×2): qty 30

## 2016-04-20 MED ORDER — TRAZODONE HCL 50 MG PO TABS
50.0000 mg | ORAL_TABLET | Freq: Every evening | ORAL | Status: DC | PRN
  Administered 2016-04-20: 50 mg via ORAL
  Filled 2016-04-20: qty 1

## 2016-04-20 MED ORDER — NAPROXEN 500 MG PO TABS
500.0000 mg | ORAL_TABLET | Freq: Two times a day (BID) | ORAL | Status: DC
  Administered 2016-04-20 – 2016-04-24 (×8): 500 mg via ORAL
  Filled 2016-04-20 (×12): qty 1

## 2016-04-20 MED ORDER — DIPHENHYDRAMINE HCL 25 MG PO CAPS
50.0000 mg | ORAL_CAPSULE | Freq: Four times a day (QID) | ORAL | Status: DC | PRN
  Administered 2016-04-20: 50 mg via ORAL
  Filled 2016-04-20: qty 2

## 2016-04-20 MED ORDER — OLANZAPINE 5 MG PO TBDP: 5.0000 mg | ORAL_TABLET | Freq: Three times a day (TID) | ORAL | Status: DC | PRN

## 2016-04-20 MED ORDER — ENSURE ENLIVE PO LIQD
237.0000 mL | Freq: Two times a day (BID) | ORAL | Status: DC
  Administered 2016-04-21 – 2016-04-23 (×5): 237 mL via ORAL

## 2016-04-20 MED ORDER — BENZONATATE 100 MG PO CAPS
200.0000 mg | ORAL_CAPSULE | Freq: Three times a day (TID) | ORAL | Status: DC | PRN
  Administered 2016-04-22 – 2016-04-24 (×3): 200 mg via ORAL
  Filled 2016-04-20: qty 20
  Filled 2016-04-20 (×3): qty 2

## 2016-04-20 MED ORDER — ALUM & MAG HYDROXIDE-SIMETH 200-200-20 MG/5ML PO SUSP
30.0000 mL | ORAL | Status: DC | PRN
  Administered 2016-04-20: 30 mL via ORAL
  Filled 2016-04-20: qty 30

## 2016-04-20 MED ORDER — ONDANSETRON HCL 4 MG PO TABS: 4.0000 mg | ORAL_TABLET | Freq: Three times a day (TID) | ORAL | Status: DC | PRN
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
## 2016-04-20 NOTE — Progress Notes (Signed)
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 Pt gait steady at time of admission. Pt arm brace placed in locker due to metal. Pt reported that she wears the brace to her right wrist due to arthritis in her right arm.

## 2016-04-20 NOTE — ED Notes (Signed)
[facility] Accepting is Dr. Lucianne MussKumar.

## 2016-04-20 NOTE — Tx Team (Signed)
Initial Treatment Plan 04/20/2016 6:20 PM [name] UXL:[id number]    PATIENT STRESSORS: Financial difficulties Marital or family conflict Medication change or noncompliance   PATIENT STRENGTHS: Ability for insight Capable of independent living Motivation for treatment/growth   PATIENT IDENTIFIED PROBLEMS: "medicaid"  "disability"                   DISCHARGE CRITERIA:  Ability to meet basic life and health needs Adequate post-discharge living arrangements Improved stabilization in mood, thinking, and/or behavior  PRELIMINARY DISCHARGE PLAN: Attend aftercare/continuing care group Attend PHP/IOP  PATIENT/FAMILY INVOLVEMENT: This treatment plan has been presented to and reviewed with the patient, Hayley Buckley, and/or family member.  The patient and family have been given the opportunity to ask questions and make suggestions.  Hayley Buckley, Hayley Wisher L, RN 04/20/2016, 6:20 PM

## 2016-04-20 NOTE — Progress Notes (Signed)
LCSW following for disposition. Patient has been referred to the following hospitals for inpatient [facility]   Will follow up with referrals.  Deretha EmoryHannah Jazzlyn Huizenga LCSW, MSW Clinical Social Work: Optician, dispensingystem [employer] Coverage for :  [phone]

## 2016-04-20 NOTE — ED Notes (Signed)
This patient came up to nursing desk requesting to leave. Pt states she is no longer SI/HI. She states that she only said those things because she knew if she didn't she wouldn't get help and at the time she needed help. Charge nurse and primary nurse made aware.

## 2016-04-20 NOTE — Progress Notes (Signed)
Pt admitted to the unit and report given to EloyLaronica, Charity fundraiserN. Orders obtained from Altoona[place]rad, NP.

## 2016-04-20 NOTE — ED Notes (Signed)
Pt signed voluntary consent [facility]. Form faxed to [phone]   Pt requested this nurse call her spouse and give an update.

## 2016-04-20 NOTE — ED Notes (Signed)
Pt has been informed of need for a urine sample, but states she is unable to provide one at this time.

## 2016-04-20 NOTE — ED Notes (Signed)
Pt given 3 packs of graham crackers. 

## 2016-04-21 ENCOUNTER — Encounter (HOSPITAL_COMMUNITY): Payer: Self-pay | Admitting: Psychiatry

## 2016-04-21 DIAGNOSIS — Z810 Family history of intellectual disabilities: Secondary | ICD-10-CM

## 2016-04-21 DIAGNOSIS — Z79899 Other long term (current) drug therapy: Secondary | ICD-10-CM

## 2016-04-21 DIAGNOSIS — F3164 Bipolar disorder, current episode mixed, severe, with psychotic features: Secondary | ICD-10-CM | POA: Clinically undetermined

## 2016-04-21 DIAGNOSIS — G4700 Insomnia, unspecified: Secondary | ICD-10-CM

## 2016-04-21 DIAGNOSIS — F1220 Cannabis dependence, uncomplicated: Secondary | ICD-10-CM | POA: Clinically undetermined

## 2016-04-21 DIAGNOSIS — R45851 Suicidal ideations: Secondary | ICD-10-CM

## 2016-04-21 MED ORDER — ARIPIPRAZOLE 5 MG PO TABS
5.0000 mg | ORAL_TABLET | Freq: Every day | ORAL | Status: DC
  Administered 2016-04-21 – 2016-04-23 (×3): 5 mg via ORAL
  Filled 2016-04-21 (×5): qty 1

## 2016-04-21 MED ORDER — LORATADINE 10 MG PO TABS
10.0000 mg | ORAL_TABLET | Freq: Every day | ORAL | Status: DC
  Administered 2016-04-21 – 2016-04-24 (×4): 10 mg via ORAL
  Filled 2016-04-21 (×6): qty 1

## 2016-04-21 MED ORDER — BENZTROPINE MESYLATE 0.5 MG PO TABS
0.5000 mg | ORAL_TABLET | Freq: Every day | ORAL | Status: DC
  Administered 2016-04-21 – 2016-04-23 (×2): 0.5 mg via ORAL
  Filled 2016-04-21 (×5): qty 1

## 2016-04-21 MED ORDER — TRAZODONE HCL 100 MG PO TABS
100.0000 mg | ORAL_TABLET | Freq: Every day | ORAL | Status: DC
  Administered 2016-04-21 – 2016-04-23 (×3): 100 mg via ORAL
  Filled 2016-04-21 (×5): qty 1

## 2016-04-21 MED ORDER — CARBAMAZEPINE ER 100 MG PO TB12
100.0000 mg | ORAL_TABLET | Freq: Two times a day (BID) | ORAL | Status: DC
  Administered 2016-04-21 – 2016-04-24 (×6): 100 mg via ORAL
  Filled 2016-04-21 (×8): qty 1

## 2016-04-21 MED ORDER — FLUTICASONE PROPIONATE 50 MCG/ACT NA SUSP
2.0000 | Freq: Every day | NASAL | Status: DC
  Administered 2016-04-21 – 2016-04-24 (×4): 2 via NASAL
  Filled 2016-04-21 (×2): qty 16

## 2016-04-21 MED ORDER — OLANZAPINE 10 MG IM SOLR: 10.0000 mg | Freq: Three times a day (TID) | INTRAMUSCULAR | Status: DC | PRN

## 2016-04-21 MED ORDER — OLANZAPINE 10 MG PO TBDP
10.0000 mg | ORAL_TABLET | Freq: Three times a day (TID) | ORAL | Status: DC | PRN
  Administered 2016-04-21: 10 mg via ORAL
  Filled 2016-04-21: qty 1

## 2016-04-21 NOTE — [facility] Suicide Risk Assessment (Signed)
[facility]   Nursing information obtained from:  Patient Demographic factors:  Caucasian, Low socioeconomic status, Unemployed Current Mental Status:  Suicidal ideation indicated by patient, Suicidal ideation indicated by others, Self-harm thoughts, Self-harm behaviors, Thoughts of violence towards others Loss Factors:  Financial problems / change in socioeconomic status Historical Factors:  Prior suicide attempts, Family history of mental illness or substance abuse Risk Reduction Factors:  Sense of responsibility to family, Positive social support  Total Time spent with patient: 20 minutes Principal Problem: Bipolar disorder, curr episode mixed, severe, with psychotic features (HCC) Diagnosis:   Patient Active Problem List   Diagnosis Date Noted  . Bipolar disorder, curr episode mixed, severe, with psychotic features (HCC) [F31.64] 04/21/2016  . Cannabis use disorder, moderate, dependence (HCC) [F12.20] 04/21/2016   Subjective Data: Please see H&P.   Continued Clinical Symptoms:  Alcohol Use Disorder Identification Test Final Score (AUDIT): 2 The "Alcohol Use Disorders Identification Test", Guidelines for Use in Primary Care, Second Edition.  World Science writerHealth Organization [employer](WHO). Score between 0-7:  no or low risk or alcohol related problems. Score between 8-15:  moderate risk of alcohol related problems. Score between 16-19:  high risk of alcohol related problems. Score 20 or above:  warrants further diagnostic evaluation for alcohol dependence and treatment.   CLINICAL FACTORS:   Bipolar Disorder:   Mixed State Alcohol/Substance Abuse/Dependencies Unstable or Poor Therapeutic Relationship Previous Psychiatric Diagnoses and Treatments Medical Diagnoses and Treatments/Surgeries   Musculoskeletal: Strength & Muscle Tone: within normal limits Gait & Station: normal Patient leans: N/A  Psychiatric Specialty Exam: Physical Exam  ROS  Blood pressure  136/76, pulse 72, temperature 97.6 F (36.4 C), resp. rate 18, height 5' 2.25" (1.581 m), weight 75.3 kg (166 lb).Body mass index is 30.12 kg/m.      Please see H&P.                                                     COGNITIVE FEATURES THAT CONTRIBUTE TO RISK:  Closed-mindedness, Polarized thinking and Thought constriction (tunnel vision)    SUICIDE RISK:   Mild:  Suicidal ideation of limited frequency, intensity, duration, and specificity.  There are no identifiable plans, no associated intent, mild dysphoria and related symptoms, good self-control (both objective and subjective assessment), few other risk factors, and identifiable protective factors, including available and accessible social support.  PLAN OF CARE: Please see H&P.   I certify that inpatient services furnished can reasonably be expected to improve the patient's condition.   Novalee Horsfall, MD 04/21/2016, 12:50 PM

## 2016-04-21 NOTE — Progress Notes (Signed)
Hayley Buckley had been up and visible in milieu this evening, did attend and participate in evening group activity. Hayley Buckley has appeared manic with tangential thought process and flight of ideas. Hayley Buckley also has appeared intrusive with some boundary issues. Hayley Buckley spoke, at length, about [facility] and spoke about how she did not feel she was treated properly. Hayley Buckley also spoke about not sleeping well and how she really needs to get a good night sleep. Hayley Buckley did receive bedtime medications without incident before retiring to her room. A. Support and encouragement provided. R. Safety maintained, will continue to monitor.

## 2016-04-21 NOTE — Tx Team (Signed)
Interdisciplinary Treatment and Diagnostic Plan Update  04/21/2016 Time of Session: 3:44 PM  Hayley Buckley MRN: [id number]  Principal Diagnosis: Bipolar disorder, curr episode mixed, severe, with psychotic features ([place])  Secondary Diagnoses: Principal Problem:   Bipolar disorder, curr episode mixed, severe, with psychotic features ([place]) Active Problems:   Cannabis use disorder, moderate, dependence ([place])   Current Medications:  Current Facility-Administered Medications  Medication Dose Route Frequency Provider Last Rate Last Dose  . alum & mag hydroxide-simeth (MAALOX/MYLANTA) 200-200-20 MG/5ML suspension 30 mL  30 mL Oral Q4H PRN Benjamine Mola, FNP   30 mL at 04/20/16 2206  . ARIPiprazole (ABILIFY) tablet 5 mg  5 mg Oral QHS Saramma Eappen, MD      . benzonatate (TESSALON) capsule 200 mg  200 mg Oral TID PRN Laverle Hobby, PA-C      . benztropine (COGENTIN) tablet 0.5 mg  0.5 mg Oral QHS Saramma Eappen, MD      . carbamazepine (TEGRETOL XR) 12 hr tablet 100 mg  100 mg Oral BID Saramma Eappen, MD      . diphenhydrAMINE (BENADRYL) capsule 50 mg  50 mg Oral Q6H PRN Laverle Hobby, PA-C   50 mg at 04/20/16 2145  . feeding supplement (ENSURE ENLIVE) (ENSURE ENLIVE) liquid 237 mL  237 mL Oral BID BM Saramma Eappen, MD   237 mL at 04/21/16 1413  . fluticasone (FLONASE) 50 MCG/ACT nasal spray 2 spray  2 spray Each Nare Daily Ethelene Hal, NP   2 spray at 04/21/16 1201  . loratadine (CLARITIN) tablet 10 mg  10 mg Oral Daily Ethelene Hal, NP   10 mg at 04/21/16 1202  . magnesium hydroxide (MILK OF MAGNESIA) suspension 30 mL  30 mL Oral Daily PRN Benjamine Mola, FNP   30 mL at 04/21/16 0736  . naproxen (NAPROSYN) tablet 500 mg  500 mg Oral BID WC Laverle Hobby, PA-C   500 mg at 04/21/16 0735  . OLANZapine zydis (ZYPREXA) disintegrating tablet 10 mg  10 mg Oral TID PRN Ursula Alert, MD   10 mg at 04/21/16 1411   Or  . OLANZapine (ZYPREXA) injection 10 mg  10 mg Intramuscular  TID PRN Ursula Alert, MD      . ondansetron (ZOFRAN) tablet 4 mg  4 mg Oral Q8H PRN Laverle Hobby, PA-C      . traZODone (DESYREL) tablet 100 mg  100 mg Oral QHS Ursula Alert, MD        PTA Medications: Prescriptions Prior to Admission  Medication Sig Dispense Refill Last Dose  . acetaminophen (TYLENOL) 500 MG tablet Take 500 mg by mouth every 8 (eight) hours as needed for mild pain.   unknown  . naproxen (NAPROSYN) 500 MG tablet Take 1 tablet (500 mg total) by mouth 2 (two) times daily with a meal. (Patient not taking: Reported on 04/17/2016) 20 tablet 0 Not Taking at Unknown time  . naproxen sodium (ALEVE) 220 MG tablet Take 220 mg by mouth daily as needed (for pain).   Past Week at Unknown time  . polyethylene glycol (MIRALAX / GLYCOLAX) packet Take 8.5-17 g by mouth 2 (two) times daily as needed for mild constipation.   Past Week at Unknown time    Treatment Modalities: Medication Management, Group therapy, Case management,  1 to 1 session with clinician, Psychoeducation, Recreational therapy.   Physician Treatment Plan for Primary Diagnosis: Bipolar disorder, curr episode mixed, severe, with psychotic features ([place]) Long Term Goal(s): Improvement  in symptoms so as ready for discharge  Short Term Goals: Ability to identify changes in lifestyle to reduce recurrence of condition will improve  Medication Management: Evaluate patient's response, side effects, and tolerance of medication regimen.  Therapeutic Interventions: 1 to 1 sessions, Unit Group sessions and Medication administration.  Evaluation of Outcomes: Not Met  Physician Treatment Plan for Secondary Diagnosis: Principal Problem:   Bipolar disorder, curr episode mixed, severe, with psychotic features ([place]) Active Problems:   Cannabis use disorder, moderate, dependence ([place])   Long Term Goal(s): Improvement in symptoms so as ready for discharge  Short Term Goals: Compliance with prescribed medications will  improve  Medication Management: Evaluate patient's response, side effects, and tolerance of medication regimen.  Therapeutic Interventions: 1 to 1 sessions, Unit Group sessions and Medication administration.  Evaluation of Outcomes: Not Met   RN Treatment Plan for Primary Diagnosis: Bipolar disorder, curr episode mixed, severe, with psychotic features ([place]) Long Term Goal(s): Knowledge of disease and therapeutic regimen to maintain health will improve  Short Term Goals: Ability to identify and develop effective coping behaviors will improve and Compliance with prescribed medications will improve  Medication Management: RN will administer medications as ordered by provider, will assess and evaluate patient's response and provide education to patient for prescribed medication. RN will report any adverse and/or side effects to prescribing provider.  Therapeutic Interventions: 1 on 1 counseling sessions, Psychoeducation, Medication administration, Evaluate responses to treatment, Monitor vital signs and CBGs as ordered, Perform/monitor CIWA, COWS, AIMS and Fall Risk screenings as ordered, Perform wound care treatments as ordered.  Evaluation of Outcomes: Not Met   LCSW Treatment Plan for Primary Diagnosis: Bipolar disorder, curr episode mixed, severe, with psychotic features ([place]) Long Term Goal(s): Safe transition to appropriate next level of care at discharge, Engage patient in therapeutic group addressing interpersonal concerns.  Short Term Goals: Engage patient in aftercare planning with referrals and resources and Increase skills for wellness and recovery  Therapeutic Interventions: Assess for all discharge needs, 1 to 1 time with Social worker, Explore available resources and support systems, Assess for adequacy in community support network, Educate family and significant other(s) on suicide prevention, Complete Psychosocial Assessment, Interpersonal group therapy.  Evaluation of  Outcomes: Not Met   Progress in Treatment: Attending groups: Yes Participating in groups: Yes Taking medication as prescribed: Yes, MD continues to assess for medication changes as needed Toleration medication: Yes, no side effects reported at this time Family/Significant other contact made: Patient declined.  Patient understands diagnosis: Limited insight  Discussing patient identified problems/goals with staff: Yes Medical problems stabilized or resolved: Yes Denies suicidal/homicidal ideation: No  Issues/concerns per patient self-inventory: None Other: N/A  New problem(s) identified: None identified at this time.   New Short Term/Long Term Goal(s): None identified at this time.   Discharge Plan or Barriers: Return home and follow up with [facility])   Reason for Continuation of Hospitalization: Anxiety Depression Homicidal ideation Mania Medication stabilization Suicidal ideation   Estimated Length of Stay: 3-5 days  Attendees: Patient: 04/21/2016  3:44 PM  Physician: Dr. Shea Evans 04/21/2016  3:44 PM  Nursing: Otilio Carpen, RN  04/21/2016  3:44 PM  RN Care Manager: Lars Pinks 04/21/2016  3:44 PM  Social Worker: Ripley Fraise, [place] 04/21/2016  3:44 PM  Recreational Therapist: Winfield Cunas 04/21/2016  3:44 PM  Other: Radonna Ricker, Social Work Intern  04/21/2016  3:44 PM  Other:  04/21/2016  3:44 PM  Other: 04/21/2016  3:44 PM    Scribe  for Treatment Team: Radonna Ricker, Social Work Intern 04/21/2016 3:44 PM

## 2016-04-21 NOTE — [facility] Group Notes (Addendum)
[facility] Group Notes:  (Counselor/Nursing/MHT/Case Management/Adjunct)  04/21/2016 1:15PM  Type of Therapy:  Group Therapy  Participation Level:  Active  Participation Quality:  Appropriate  Affect:  Flat  Cognitive:  Oriented  Insight:  Improving  Engagement in Group:  Limited  Engagement in Therapy:  Limited  Modes of Intervention:  Discussion, Exploration and Socialization  Summary of Progress/Problems: The topic for group was balance in life.  Pt participated in the discussion about when their life was in balance and out of balance and how this feels.  Pt discussed ways to get back in balance and short term goals they can work on to get where they want to be. Hayley Buckley stated she is unbalanced today, and she knows this because she is tearful at the drop of a hat.  Talked about her current relationship, and she does not like the way she is being treated.  She did admit that she has been married three times, and that each has been challenging.  Quick to external problems. Became focused on the past, and how it reminds her of a better time. Tangential.  Became tearful multiple times as she described conflictual relationships.  Hayley Buckley, Hayley Buckley 04/21/2016 1:10 PM

## 2016-04-21 NOTE — Progress Notes (Signed)
NUTRITION ASSESSMENT  Pt identified as at risk on the Malnutrition Screen Tool  INTERVENTION: 1. Educated patient on the importance of nutrition and encouraged intake of food and beverages. 2. Discussed weight goals. 3. Supplements: Ensure Enlive po BID, each supplement provides 350 kcal and 20 grams of protein  NUTRITION DIAGNOSIS: Unintentional weight loss related to sub-optimal intake as evidenced by pt report.   Goal: Pt to meet >/= 90% of their estimated nutrition needs.  Monitor:  PO intake  Assessment:  51 y.o. female with PMHx of ovarian cyst, thyroid disease.   Patient reports her appetite is fine here. She reports her main issue PTA is limited access to food. Patient reports that she and her fiance only get $75 in food stamps for the entire month, which is not enough to cover costs of food. She also has limited access to transportation to get to a grocery store. Patient reports some constipation now. She reports her weight goes up and down depending on her access to food. Unable to provide usual body weight or clear weight history. Per chart patient is within expected weight range in chart (has been 166-170 lbs since 01/2016). Patient reports she likes the vanilla and strawberry Ensure and wants to keep drinking them even though she is eating well here. Discussed with RN.   Height: Ht Readings from Last 1 Encounters:  04/20/16 5' 2.25" (1.581 m)    Weight: Wt Readings from Last 1 Encounters:  04/20/16 166 lb (75.3 kg)    Weight Hx: Wt Readings from Last 10 Encounters:  04/20/16 166 lb (75.3 kg)  04/19/16 170 lb (77.1 kg)  04/17/16 170 lb (77.1 kg)  03/29/16 170 lb (77.1 kg)  03/23/16 169 lb (76.7 kg)  02/27/16 166 lb (75.3 kg)  02/10/16 166 lb 8 oz (75.5 kg)    BMI:  Body mass index is 30.12 kg/m. Pt meets criteria for Obesity Class I based on current BMI.  Estimated Nutritional Needs: Kcal: 25-30 kcal/kg Protein: > 1 gram protein/kg Fluid: 1  ml/kcal  Diet Order: Diet regular Room service appropriate? Yes; Fluid consistency: Thin Pt is also offered choice of unit snacks mid-morning and mid-afternoon.  Pt is eating as desired.   Lab results and medications reviewed.   Hayley RimaLeanne Shanele Nissan, MS, RD, LDN Pager: [phone] After Hours Pager: [phone]

## 2016-04-21 NOTE — [facility] Suicide Risk Assessment (Signed)
[facility] INPATIENT:  Family/Significant Other Suicide Prevention Education  Suicide Prevention Education:  Patient Refusal for Family/Significant Other Suicide Prevention Education: The patient Hayley Buckley has refused to provide written consent for family/significant other to be provided Family/Significant Other Suicide Prevention Education during admission and/or prior to discharge.  Physician notified.  Patient declined SPE for family/significant other. She stated that she doesn't want any of her family to know and that she can tell her fiancee' everything he needs to know.   Baldo DaubJolan Naja Apperson 04/21/2016, 3:12 PM

## 2016-04-21 NOTE — Progress Notes (Signed)
Recreation Therapy Notes  Date: 04/21/16 Time: 1000 Location: 500 Hall Dayroom  Group Topic: Self-Esteem  Goal Area(s) Addresses:  Patient will identify positive ways to increase self-esteem. Patient will verbalize benefit of increased self-esteem.  Behavioral Response: Minimal  Intervention: Worksheet, colored pencils  Activity: Crest.  Patients were given a blank crest.  The crest was divided into four sections.  In each section, patients were to highlight something positive about themselves.  Patients could choose from topics such as proudest moment, best quality, biggest achievement, favorite activity, best feature or they could come up with different topics on their own.  Education:  Self-Esteem, Building control surveyorDischarge Planning.   Education Outcome: Acknowledges education/In group clarification offered/Needs additional education  Clinical Observations/Feedback: Pt arrived late and in and out of group.  Pt was focused on getting some nasal spray.  Pt started activity but left to speak with nurse and came back at the end of group.  Pt wanted to hold on to her project and complete it later.   Caroll RancherMarjette Liboria Putnam, LRT/CTRS      Caroll RancherLindsay, Arlie Posch A 04/21/2016 11:57 AM
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
## 2016-04-21 NOTE — Progress Notes (Signed)
Nursing Note 04/21/2016 [id number]  Data Reports sleeping fair with PRN sleep med.  Rates depression 3/10, hopelessness 0/10, and anxiety 0/10. Affect appropriate.  Denies HI, SI, AVH.  Attending groups.  Attending groups.  C/O nasal congestion, rhinitis.  Very focused on conflict she has with her significant other.  Somatic. Anxious in afternoon.  Action Spoke with patient 1:1, nurse offered support to patient throughout shift. Rec'd order for claritin and flonase- given.  [facility] Continues to be monitored on 15 minute checks for safety.  Response Remains safe on unit. Claritin and flonase effective.

## 2016-04-21 NOTE — Progress Notes (Signed)
Adult Psychoeducational Group Note  Date:  04/21/2016 Time:  12:11 AM  Group Topic/Focus:  Wrap-Up Group:   The focus of this group is to help patients review their daily goal of treatment and discuss progress on daily workbooks.  Participation Level:  Active  Participation Quality:  Appropriate  Affect:  Appropriate  Cognitive:  Alert  Insight: Appropriate  Engagement in Group:  Engaged  Modes of Intervention:  Discussion  Additional Comments:  Patient states, "I had a rough night at [employer]". [facility]   Hayley Buckley L Verner Mccrone 04/21/2016, 12:11 AM

## 2016-04-22 DIAGNOSIS — R0981 Nasal congestion: Secondary | ICD-10-CM | POA: Clinically undetermined

## 2016-04-22 LAB — RAPID HIV SCREEN (HIV 1/2 AB+AG)
HIV 1/2 ANTIBODIES: NONREACTIVE
HIV-1 P24 ANTIGEN - HIV24: NONREACTIVE

## 2016-04-22 LAB — LIPID PANEL
Cholesterol: 148 mg/dL (ref 0–200)
HDL: 43 mg/dL (ref >40)
LDL CALC: 76 mg/dL (ref 0–99)
TRIGLYCERIDES: 144 mg/dL (ref <150)
Total CHOL/HDL Ratio: 3.4 RATIO
VLDL: 29 mg/dL (ref 0–40)

## 2016-04-22 LAB — RPR: RPR: NONREACTIVE

## 2016-04-22 LAB — TSH: TSH: 3.568 u[IU]/mL (ref 0.350–4.500)

## 2016-04-22 LAB — GC/CHLAMYDIA PROBE AMP (~~LOC~~) NOT AT ARMC
CHLAMYDIA, DNA PROBE: NEGATIVE
NEISSERIA GONORRHEA: NEGATIVE

## 2016-04-22 NOTE — [facility] Group Notes (Signed)
Type of Therapy:  Group Therapy   Participation Level:  Engaged  Participation Quality:  Attentive   Affect:  Appropriate   Cognitive:  Alert   Insight:  Engaged  Engagement in Therapy:  Improving   Mode s of Intervention:  Education, Exploration, Socialization   Summary of Progress/Problems: Hayley Buckley was asleep majority of the group presentation. She was in attendance the entire time.   Hayley Buckley from [facility] [employer] [facility] was here to tell her story of recovery and inform patients about [facility] and their services.

## 2016-04-22 NOTE — Progress Notes (Addendum)
[facility] MD Progress Note  04/22/2016 12:46 PM Hayley Buckley  MRN:  [id number] Subjective: Patient states " I feel so tired , I feel like I am overmedicated . May be not , may be it is just the benadryl that they gave me for my allergies . I do not want to take them anymore."  Objective: Patient seen and chart reviewed.Discussed patient with treatment team.  Pt today seen as tearful, labile, anxious and groggy. Pt reports that she feels overmedicated from the benadryl last night which given for allergies. Discussed that her benadryl can be discontinued and she can make use of clartine and other treatment . Pt agrees. Pt continues to be pressured , but is more redirectable . Pt continues to be tearful and depressed. Pt focussed on discharge -it was discussed that her medications needs to be readjusted to target her sx. Pt reports she does not want any increase in her dose today - will continue same .   Principal Problem: Bipolar disorder, curr episode mixed, severe, with psychotic features (HCC) Diagnosis:   Patient Active Problem List   Diagnosis Date Noted  . Nasal congestion [R09.81] 04/22/2016  . Bipolar disorder, curr episode mixed, severe, with psychotic features (HCC) [F31.64] 04/21/2016  . Cannabis use disorder, moderate, dependence (HCC) [F12.20] 04/21/2016   Total Time spent with patient: 25 minutes  Past Psychiatric History: Please see H&P.   Past Medical History:  Past Medical History:  Diagnosis Date  . Esophageal abnormality   . Migraine   . Ovarian cyst   . Panic attack   . Thyroid disease     Past Surgical History:  Procedure Laterality Date  . CHOLECYSTECTOMY    . gastrectomy tube     Family History:  Family History  Problem Relation Age of Onset  . Alzheimer's disease Mother   . Diabetes Mother   . Lung disease Father   . Bipolar disorder Father   . Heart attack Brother   . Diabetes Other   . Heart attack Other   . Cancer Other   . ALS Other    Family  Psychiatric  History: Please see H&P.  Social History: Please see H&P.  History  Alcohol Use  . Yes    Comment: social     History  Drug Use No    Social History   Social History  . Marital status: Widowed    Spouse name: N/A  . Number of children: N/A  . Years of education: N/A   Social History Main Topics  . Smoking status: Current Every Day Smoker    Packs/day: 0.50    Types: Cigarettes  . Smokeless tobacco: Never Used  . Alcohol use Yes     Comment: social  . Drug use: No  . Sexual activity: Yes    Birth control/ protection: None   Other Topics Concern  . None   Social History Narrative  . None   Additional Social History:                         Sleep: Fair  Appetite:  Fair  Current Medications: Current Facility-Administered Medications  Medication Dose Route Frequency Provider Last Rate Last Dose  . alum & mag hydroxide-simeth (MAALOX/MYLANTA) 200-200-20 MG/5ML suspension 30 mL  30 mL Oral Q4H PRN Beau Fanny, FNP   30 mL at 04/20/16 2206  . ARIPiprazole (ABILIFY) tablet 5 mg  5 mg Oral QHS Jomarie Longs, MD   5  mg at 04/21/16 2112  . benzonatate (TESSALON) capsule 200 mg  200 mg Oral TID PRN Kerry HoughSpencer E Simon, PA-C   200 mg at 04/22/16 1143  . benztropine (COGENTIN) tablet 0.5 mg  0.5 mg Oral QHS Jomarie LongsSaramma Amori Cooperman, MD   0.5 mg at 04/21/16 2112  . carbamazepine (TEGRETOL XR) 12 hr tablet 100 mg  100 mg Oral BID Jomarie LongsSaramma Johann Santone, MD   100 mg at 04/22/16 0835  . feeding supplement (ENSURE ENLIVE) (ENSURE ENLIVE) liquid 237 mL  237 mL Oral BID BM Dulse Rutan, MD   237 mL at 04/22/16 1000  . fluticasone (FLONASE) 50 MCG/ACT nasal spray 2 spray  2 spray Each Nare Daily Laveda AbbeLaurie Britton Parks, NP   2 spray at 04/22/16 [phone]  . loratadine (CLARITIN) tablet 10 mg  10 mg Oral Daily Laveda AbbeLaurie Britton Parks, NP   10 mg at 04/22/16 [id number]  . magnesium hydroxide (MILK OF MAGNESIA) suspension 30 mL  30 mL Oral Daily PRN Beau FannyJohn C Withrow, FNP   30 mL at 04/21/16 0736  .  naproxen (NAPROSYN) tablet 500 mg  500 mg Oral BID WC Kerry HoughSpencer E Simon, PA-C   500 mg at 04/22/16 [id number]  . OLANZapine zydis (ZYPREXA) disintegrating tablet 10 mg  10 mg Oral TID PRN Jomarie LongsSaramma Shiane Wenberg, MD   10 mg at 04/21/16 1411   Or  . OLANZapine (ZYPREXA) injection 10 mg  10 mg Intramuscular TID PRN Jomarie LongsSaramma Myrian Botello, MD      . ondansetron (ZOFRAN) tablet 4 mg  4 mg Oral Q8H PRN Kerry HoughSpencer E Simon, PA-C      . traZODone (DESYREL) tablet 100 mg  100 mg Oral QHS Jomarie LongsSaramma Clayton Bosserman, MD   100 mg at 04/21/16 2112    Lab Results:  Results for orders placed or performed during the hospital encounter of 04/20/16 (from the past 48 hour(s))  TSH     Status: None   Collection Time: 04/22/16  6:06 AM  Result Value Ref Range   TSH 3.568 0.350 - 4.500 uIU/mL    Comment: Performed by a 3rd Generation assay with a functional sensitivity of <=0.01 uIU/mL. Performed at [facility], [address] [place], [place] [address]   Lipid panel     Status: None   Collection Time: 04/22/16  6:06 AM  Result Value Ref Range   Cholesterol 148 0 - 200 mg/dL   Triglycerides 413144 <244<150 mg/dL   HDL 43 >01>40 mg/dL   Total CHOL/HDL Ratio 3.4 RATIO   VLDL 29 0 - 40 mg/dL   LDL Cholesterol 76 0 - 99 mg/dL    Comment:        Total Cholesterol/HDL:CHD Risk Coronary Heart Disease Risk Table                     Men   Women  1/2 Average Risk   3.4   3.3  Average Risk       5.0   4.4  2 X Average Risk   9.6   7.1  3 X Average Risk  23.4   11.0        Use the calculated Patient Ratio above and the CHD Risk Table to determine the patient's CHD Risk.        ATP III CLASSIFICATION (LDL):  <100     mg/dL   Optimal  [id number]  mg/dL   Near or Above  Optimal  130-159  mg/dL   Borderline  161-096  mg/dL   High  >045     mg/dL   Very High Performed at [facility], [address] [place], [place] [address]   Rapid HIV screen (HIV 1/2 Ab+Ag)     Status: None   Collection Time: 04/22/16  6:06 AM   Result Value Ref Range   HIV-1 P24 Antigen - HIV24 NON REACTIVE NON REACTIVE   HIV 1/2 Antibodies NON REACTIVE NON REACTIVE   Interpretation (HIV Ag Ab)      A non reactive test result means that HIV 1 or HIV 2 antibodies and HIV 1 p24 antigen were not detected in the specimen.    Comment: RESULT CALLED TO, READ BACK BY AND VERIFIED WITHGraciela Husbands RN AT [phone] 04/22/16 MULLINS,T Performed at [facility], [address], [place], [place] [address]     Blood Alcohol level:  Lab Results  Component Value Date   ETH <5 04/19/2016    Metabolic Disorder Labs: No results found for: HGBA1C, MPG No results found for: PROLACTIN Lab Results  Component Value Date   CHOL 148 04/22/2016   TRIG 144 04/22/2016   HDL 43 04/22/2016   CHOLHDL 3.4 04/22/2016   VLDL 29 04/22/2016   LDLCALC 76 04/22/2016    Physical Findings: AIMS: Facial and Oral Movements Muscles of Facial Expression: None, normal Lips and Perioral Area: None, normal Jaw: None, normal Tongue: None, normal,Extremity Movements Upper (arms, wrists, hands, fingers): None, normal Lower (legs, knees, ankles, toes): None, normal, Trunk Movements Neck, shoulders, hips: None, normal, Overall Severity Severity of abnormal movements (highest score from questions above): None, normal Incapacitation due to abnormal movements: None, normal Patient's awareness of abnormal movements (rate only patient's report): No Awareness, Dental Status Current problems with teeth and/or dentures?: No Does patient usually wear dentures?: No  CIWA:    COWS:     Musculoskeletal: Strength & Muscle Tone: within normal limits Gait & Station: normal Patient leans: N/A  Psychiatric Specialty Exam: Physical Exam  Nursing note and vitals reviewed.   Review of Systems  Constitutional: Positive for malaise/fatigue.  Musculoskeletal: Positive for myalgias.  Psychiatric/Behavioral: Positive for depression. The patient is  nervous/anxious.   All other systems reviewed and are negative.   Blood pressure 138/75, pulse 97, temperature 98.7 F (37.1 C), temperature source Oral, resp. rate 16, height 5' 2.25" (1.581 m), weight 75.3 kg (166 lb).Body mass index is 30.12 kg/m.  General Appearance: Casual  Eye Contact:  Fair  Speech:  Pressured  Volume:  Normal  Mood:  Anxious and Depressed  Affect:  Labile and Tearful  Thought Process:  Coherent and Descriptions of Associations: Tangential  Orientation:  Full (Time, Place, and Person)  Thought Content:  Rumination  Suicidal Thoughts:  No  Homicidal Thoughts:  No  Memory:  Immediate;   Fair Recent;   Fair Remote;   Fair  Judgement:  Impaired  Insight:  Shallow  Psychomotor Activity:  Restlessness  Concentration:  Concentration: Fair and Attention Span: Fair  Recall:  [employer] of Knowledge:  Fair  Language:  Fair  Akathisia:  No  Handed:  Right  AIMS (if indicated):     Assets:  Desire for Improvement  ADL's:  Intact  Cognition:  WNL  Sleep:  Number of Hours: 6     Treatment Plan Summary:Patient seen as tearful, groggy , labile , continue to need medication readjustments .  Bipolar disorder, curr episode  mixed, severe, with psychotic features (HCC)  unstable Will continue today 04/22/16  plan as below except where it is noted.   Daily contact with patient to assess and evaluate symptoms and progress in treatment, Medication management and Plan see below  For mood sx: Continue Tegretol XR 100 mg po bid . For mood sx/psychosis: Abilify 5 mg po qhs.patient does not want any dose increase today since she feels overmedicated. For insomnia: Trazodone 100 mg po qhs. For cannabis abuse: continue to provide substance abuse counseling. For nasal congestion: likely seasonal allergies - discontinue Benadryl PO , start Claritin 10 mg po daily, flonase spray daily. Labs reviewed - tsh - wnl, lipid panel - wnl , HIV - NR , pending hba1c, pl, pending tegretol  level . EKG reviewed - qtc wnl. CSW will continue to work on disposition. Patient to continue to participate in groups and milieu.  Azhar Yogi, MD 04/22/2016, 12:46 PM

## 2016-04-22 NOTE — Progress Notes (Signed)
Pt in room in bed reading a magazine.  Pt calm and responsive and cooperative.  Pt has a list of complaints including sore wrist, stomach ache and is upset when I introduce myself as her nephew has the same name as this write and died a year ago.  Pt c/o cough and wants cough syrup but pt does not cough at all during assessment.  Pt currently receives cough medicine.   Pt denies SI, HI and AVH.  Pt does not request anything for pain or discomfort.   Pt attends group and gets a snack. Pt remains safe on unit.

## 2016-04-22 NOTE — Progress Notes (Signed)
Recreation Therapy Notes  Date: 04/22/16 Time: 1000 Location: 500 Hall Dayroom  Group Topic: Leisure Education  Goal Area(s) Addresses:  Patient will identify positive leisure activities.  Patient will identify one positive benefit of participation in leisure activities.   Behavioral Response: Engaged  Intervention: Various leisure activities, dry erase marker, dry erase board, eraser  Activity: Leisure Pictionary.  Patients were to pull strips of paper from a can with a leisure activity on it.  Patients were to draw the activity they pulled on the board.  The remaining patients were to guess what the activity is.  The person who guesses the activity will then get an opportunity to pull and draw the next activity.  Education:  Leisure Education, Building control surveyorDischarge Planning  Education Outcome: Acknowledges education/In group clarification offered/Needs additional education  Clinical Observations/Feedback: Pt was coloring a picture but was still engaged in the activity.  Pt was also social with peers.     Caroll RancherMarjette Rosebud Koenen, LRT/CTRS      Caroll RancherLindsay, Lazer Wollard A 04/22/2016 11:49 AM

## 2016-04-22 NOTE — Progress Notes (Signed)
DAR NOTE: Patient presents with anxious affect and depressed mood.  Denies pain, auditory and visual hallucinations.  Rates depression at 0, hopelessness at 0, and anxiety at 0.  Maintained on routine safety checks.  Medications given as prescribed.  Support and encouragement offered as needed.  Attended group and participated.  States goal for today is "anger, patience."  Patient observed socializing with peers in the dayroom.  Patient with lots of somatic complaints.  Tessalon given for complain of cough with good effect.

## 2016-04-22 NOTE — Progress Notes (Signed)
Hayley Buckley had been up and visible in milieu this evening, participating and seen interacting appropriately with peers in milieu. Hayley Buckley had reported that she was feeling tired this evening and spoke about how she woke up at 4am and could not go back to sleep and spoke about needing a better night sleep tonight. Hayley Buckley did receive medications without incident and was seen dozing off in dayroom before retiring to her room around 11 pm this evening. A. Support and encouragement provided. R. Safety maintained, will continue to monitor.

## 2016-04-22 NOTE — Progress Notes (Signed)
Adult Psychoeducational Group Note  Date:  04/22/2016 Time:  10:21 PM  Group Topic/Focus:  Wrap-Up Group:   The focus of this group is to help patients review their daily goal of treatment and discuss progress on daily workbooks.  Participation Level:  Active  Participation Quality:  Appropriate  Affect:  Appropriate  Cognitive:  Alert  Insight: Appropriate  Engagement in Group:  Engaged  Modes of Intervention:  Discussion  Additional Comments:  Patient states, "I had a good day". Patient also states that she will be discharging on Saturday. Patient's goal for today was to not cry. Patient met goal.   Montavis Schubring L Caralee Morea 04/22/2016, 10:21 PM

## 2016-04-22 NOTE — Progress Notes (Deleted)
Pt on unit in dayroom after visit from wife.  Pt is happy, cooperative, smiles and makes good eye contact.  Pt denies pain and discomfort.  Pt denies SI, HI and AVH.  Pt happy that he will leave tomorrow and has plan to go to [facility] on Monday.  Pt has plan to obtain meds and sts he will stay on them. Pt attends group and gets a snack.   Pt remains safe on unit. 

## 2016-04-22 NOTE — Progress Notes (Signed)
Recreation Therapy Notes  INPATIENT RECREATION THERAPY ASSESSMENT  Patient Details Name: Hayley Buckley MRN: [id number] DOB: Oct 04, 1965 Today's Date: 04/22/2016  Patient Stressors: Relationship, Other (Comment) (No food in the house)  Pt stated she was here for suicidal and homicidal thoughts towards fiance. Pt stated her fiance is a stressor.  Coping Skills:   Avoidance, Exercise, Art/Dance, Talking, Music, Other (Comment) (Poetry)  Personal Challenges: Anger, Communication, Concentration, Decision-Making, Problem-Solving, Relationships, Social Interaction, Stress Management, Time Management, Trusting Others, Work Nutritional therapisterformance  Leisure Interests (2+):  Art - MudloggerColoring, Music - Listen, Music - Singing  Awareness of Community Resources:  Yes  Community Resources:  [place], [place] [employer], [employer] (Comment) Wellsite geologist([employer])  Current Use: Yes  Patient Strengths:  Data processing manageratience; helping others  Patient Identified Areas of Improvement:  Relationship with fiance; avoid arguments with fiance  Current Recreation Participation:  "Everyday"  Patient Goal for Hospitalization:  "To learn how to cope with stress and avoid arguments with fiance"  [place]  [place] Residence:  [place]  Current [place] (including self-harm):  No  Current HI:  No  Consent to Intern Participation: N/A   Caroll RancherMarjette Servando Kyllonen, LRT/CTRS  Caroll RancherLindsay, Charnika Herbst A 04/22/2016, 12:08 PM

## 2016-04-23 LAB — PROLACTIN: Prolactin: 22.5 ng/mL (ref 4.8–23.3)

## 2016-04-23 LAB — HEMOGLOBIN A1C
Hgb A1c MFr Bld: 5.4 % (ref 4.8–5.6)
MEAN PLASMA GLUCOSE: 108

## 2016-04-23 LAB — HEPATITIS PANEL, ACUTE
HCV Ab: <0.1 s/co ratio (ref 0.0–0.9)
HEP B S AG: NEGATIVE
Hep A IgM: NEGATIVE
Hep B C IgM: NEGATIVE

## 2016-04-23 MED ORDER — GUAIFENESIN ER 600 MG PO TB12
600.0000 mg | ORAL_TABLET | Freq: Two times a day (BID) | ORAL | Status: DC
  Filled 2016-04-23 (×3): qty 1

## 2016-04-23 MED ORDER — PNEUMOCOCCAL VAC POLYVALENT 25 MCG/0.5ML IJ INJ
0.5000 mL | INJECTION | INTRAMUSCULAR | Status: AC
  Administered 2016-04-23: 0.5 mL via INTRAMUSCULAR

## 2016-04-23 MED ORDER — GUAIFENESIN-DM 100-10 MG/5ML PO SYRP
5.0000 mL | ORAL_SOLUTION | ORAL | Status: DC | PRN
  Administered 2016-04-24 (×3): 5 mL via ORAL
  Filled 2016-04-23 (×3): qty 5

## 2016-04-23 MED ORDER — ALBUTEROL SULFATE (2.5 MG/3ML) 0.083% IN NEBU
2.5000 mg | INHALATION_SOLUTION | Freq: Four times a day (QID) | RESPIRATORY_TRACT | Status: AC
  Administered 2016-04-23 – 2016-04-24 (×4): 2.5 mg via RESPIRATORY_TRACT
  Filled 2016-04-23 (×4): qty 3

## 2016-04-23 NOTE — Progress Notes (Signed)
D: Patient's self inventory sheet: patient has good sleep, recieved sleep medication.good  Appetite, normal energy level, good concentration. Rated depression 0/10, hopeless 0/10, anxiety 0/10. SI/HI/AVH: denies all. Physical complaints are blurred vision, swollen feet, pain in back, left shoulder and neck. Patient also complains of congestion that is interfering with sleep.  Goal is "going home my fianace needs me at home" . Plans to work on "go to all groups".   A: Medications administered, assessed medication knowledge and education given on medication regimen. Robitussin and nebulizer started for congestion.   Emotional support and encouragement given patient. R: Denies SI and HI , contracts for safety. Safety maintained with 15 minute checks.

## 2016-04-23 NOTE — [facility] Group Notes (Signed)
[facility] LCSW Group Therapy  04/23/2016  1:05 PM  Type of Therapy:  Group therapy  Participation Level:  Active  Participation Quality:  Attentive  Affect:  Flat  Cognitive:  Oriented  Insight:  Limited  Engagement in Therapy:  Limited  Modes of Intervention:  Discussion, Socialization  Summary of Progress/Problems:  Chaplain was here to lead a group on themes of hope and courage. Went on a long drawn out story about being taken advantage of at [employer], and not getting the training she needed.  Unrelated to theme of hope, though she assured us several times that it was.  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  Hayley Buckley, Hayley Buckley 04/23/2016 1:18 PM

## 2016-04-23 NOTE — Progress Notes (Signed)
[facility]  04/23/2016 2:06 PM Hayley Buckley  MRN:  [id number] Subjective: Patient states "I still have the cough , and I feel congested , but I feels less tired and my mood is improving."     Objective: Patient seen and chart reviewed.Discussed patient with treatment team.  Pt today seen as less tearful, less anxious , reports medications are working. Pt continues to be preoccupied with her nasal congestion and cough. Discussed to make use of PRN medications. Denies ADRs to medications. Continue to encourage and support.      Principal Problem: Bipolar disorder, curr episode mixed, severe, with psychotic features (HCC) Diagnosis:   Patient Active Problem List   Diagnosis Date Noted  . Nasal congestion [R09.81] 04/22/2016  . Bipolar disorder, curr episode mixed, severe, with psychotic features (HCC) [F31.64] 04/21/2016  . Cannabis use disorder, moderate, dependence (HCC) [F12.20] 04/21/2016   Total Time spent with patient: 20 minutes  Past Psychiatric History: Please see H&P.   Past Medical History:  Past Medical History:  Diagnosis Date  . Esophageal abnormality   . Migraine   . Ovarian cyst   . Panic attack   . Thyroid disease     Past Surgical History:  Procedure Laterality Date  . CHOLECYSTECTOMY    . gastrectomy tube     Family History:  Family History  Problem Relation Age of Onset  . Alzheimer's disease Mother   . Diabetes Mother   . Lung disease Father   . Bipolar disorder Father   . Heart attack Brother   . Diabetes Other   . Heart attack Other   . Cancer Other   . ALS Other    Family Psychiatric  History: Please see H&P.  Social History: Please see H&P.  History  Alcohol Use  . Yes    Comment: social     History  Drug Use No    Social History   Social History  . Marital status: Widowed    Spouse name: N/A  . Number of children: N/A  . Years of education: N/A   Social History Main Topics  . Smoking status: Current Every  Day Smoker    Packs/day: 0.50    Types: Cigarettes  . Smokeless tobacco: Never Used  . Alcohol use Yes     Comment: social  . Drug use: No  . Sexual activity: Yes    Birth control/ protection: None   Other Topics Concern  . None   Social History Narrative  . None   Additional Social History:                         Sleep: Fair  Appetite:  Fair  Current Medications: Current Facility-Administered Medications  Medication Dose Route Frequency Provider Last Rate Last Dose  . albuterol (PROVENTIL) (2.5 MG/3ML) 0.083% nebulizer solution 2.5 mg  2.5 mg Nebulization QID Jomarie LongsSaramma Rito Lecomte, MD      . alum & mag hydroxide-simeth (MAALOX/MYLANTA) 200-200-20 MG/5ML suspension 30 mL  30 mL Oral Q4H PRN Beau FannyJohn C Withrow, FNP   30 mL at 04/20/16 2206  . ARIPiprazole (ABILIFY) tablet 5 mg  5 mg Oral QHS Jomarie LongsSaramma Derik Fults, MD   5 mg at 04/22/16 2112  . benzonatate (TESSALON) capsule 200 mg  200 mg Oral TID PRN Kerry HoughSpencer E Simon, PA-C   200 mg at 04/22/16 2350  . benztropine (COGENTIN) tablet 0.5 mg  0.5 mg Oral QHS Jomarie LongsSaramma Kairon Shock, MD   0.5 mg  at 04/21/16 2112  . carbamazepine (TEGRETOL XR) 12 hr tablet 100 mg  100 mg Oral BID Jomarie Longs, MD   100 mg at 04/23/16 0810  . feeding supplement (ENSURE ENLIVE) (ENSURE ENLIVE) liquid 237 mL  237 mL Oral BID BM Dhanvin Szeto, MD   237 mL at 04/22/16 1500  . fluticasone (FLONASE) 50 MCG/ACT nasal spray 2 spray  2 spray Each Nare Daily Laveda Abbe, NP   2 spray at 04/23/16 0810  . guaiFENesin-dextromethorphan (ROBITUSSIN DM) 100-10 MG/5ML syrup 5 mL  5 mL Oral Q4H PRN Jomarie Longs, MD      . loratadine (CLARITIN) tablet 10 mg  10 mg Oral Daily Laveda Abbe, NP   10 mg at 04/23/16 0810  . magnesium hydroxide (MILK OF MAGNESIA) suspension 30 mL  30 mL Oral Daily PRN Beau Fanny, FNP   30 mL at 04/22/16 1604  . naproxen (NAPROSYN) tablet 500 mg  500 mg Oral BID WC Kerry Hough, PA-C   500 mg at 04/23/16 0810  . OLANZapine zydis  (ZYPREXA) disintegrating tablet 10 mg  10 mg Oral TID PRN Jomarie Longs, MD   10 mg at 04/21/16 1411   Or  . OLANZapine (ZYPREXA) injection 10 mg  10 mg Intramuscular TID PRN Jomarie Longs, MD      . ondansetron (ZOFRAN) tablet 4 mg  4 mg Oral Q8H PRN Kerry Hough, PA-C      . [START ON 04/24/2016] pneumococcal 23 valent vaccine (PNU-IMMUNE) injection 0.5 mL  0.5 mL Intramuscular Tomorrow-1000 Teela Narducci, MD      . traZODone (DESYREL) tablet 100 mg  100 mg Oral QHS Jomarie Longs, MD   100 mg at 04/22/16 2112    Lab Results:  Results for orders placed or performed during the hospital encounter of 04/20/16 (from the past 48 hour(s))  TSH     Status: None   Collection Time: 04/22/16  6:06 AM  Result Value Ref Range   TSH 3.568 0.350 - 4.500 uIU/mL    Comment: Performed by a 3rd Generation assay with a functional sensitivity of <=0.01 uIU/mL. Performed at [facility], [address], [place], [place] [address]   Lipid panel     Status: None   Collection Time: 04/22/16  6:06 AM  Result Value Ref Range   Cholesterol 148 0 - 200 mg/dL   Triglycerides 952 <841 mg/dL   HDL 43 >32 mg/dL   Total CHOL/HDL Ratio 3.4 RATIO   VLDL 29 0 - 40 mg/dL   LDL Cholesterol 76 0 - 99 mg/dL    Comment:        Total Cholesterol/HDL:CHD Risk Coronary Heart Disease Risk Table                     Men   Women  1/2 Average Risk   3.4   3.3  Average Risk       5.0   4.4  2 X Average Risk   9.6   7.1  3 X Average Risk  23.4   11.0        Use the calculated Patient Ratio above and the CHD Risk Table to determine the patient's CHD Risk.        ATP III CLASSIFICATION (LDL):  <100     mg/dL   Optimal  440-102  mg/dL   Near or Above  Optimal  130-159  mg/dL   Borderline  161-096  mg/dL   High  >045     mg/dL   Very High Performed at [facility], [address] [place], [place] [address]   Hemoglobin A1c     Status: None   Collection Time: 04/22/16   6:06 AM  Result Value Ref Range   Hgb A1c MFr Bld 5.4 4.8 - 5.6 %    Comment: (NOTE)         Pre-diabetes: 5.7 - 6.4         Diabetes: >6.4         Glycemic control for adults with diabetes: <7.0    Mean Plasma Glucose 108     Comment: (NOTE) Performed At: [facility] [address] [place], [place] [id number] Mila Homer MD [id number] Performed at [facility], [address] [place], [place] [address]   Prolactin     Status: None   Collection Time: 04/22/16  6:06 AM  Result Value Ref Range   Prolactin 22.5 4.8 - 23.3 ng/mL    Comment: (NOTE) Performed At: [facility] [address] [place], [place] [id number] Mila Homer MD NU:[id number] Performed at [facility], [address] [place], [place] [address]   Rapid HIV screen (HIV 1/2 Ab+Ag)     Status: None   Collection Time: 04/22/16  6:06 AM  Result Value Ref Range   HIV-1 P24 Antigen - HIV24 NON REACTIVE NON REACTIVE   HIV 1/2 Antibodies NON REACTIVE NON REACTIVE   Interpretation (HIV Ag Ab)      A non reactive test result means that HIV 1 or HIV 2 antibodies and HIV 1 p24 antigen were not detected in the specimen.    Comment: RESULT CALLED TO, READ BACK BY AND VERIFIED WITHGraciela Husbands RN AT [phone] 04/22/16 MULLINS,T Performed at [facility], [address], [place], [place] [address]   RPR     Status: None   Collection Time: 04/22/16  6:06 AM  Result Value Ref Range   RPR Ser Ql Non Reactive Non Reactive    Comment: (NOTE) Performed At: [facility] [address] [place], [place] [id number] Mila Homer MD CZ:[id number] Performed at [facility], [address], [place], [place] [address]   Hepatitis panel, acute     Status: None   Collection Time: 04/22/16  6:06 AM  Result Value Ref Range   Hepatitis B Surface Ag Negative Negative   HCV Ab <0.1 0.0 - 0.9 s/co ratio    Comment: (NOTE)                                   Negative:     < 0.8                             Indeterminate: 0.8 - 0.9                                  Positive:     > 0.9 The CDC recommends that a positive HCV antibody result be followed up with a HCV Nucleic Acid Amplification test (557322). Performed At: [facility] [address] [place], [place] [id number] Mila Homer MD BJ:[id number]  Hep A IgM Negative Negative   Hep B C IgM Negative Negative    Comment: Performed at [facility], [address], [place], [place] [address]    Blood Alcohol level:  Lab Results  Component Value Date   ETH <5 04/19/2016    Metabolic Disorder Labs: Lab Results  Component Value Date   HGBA1C 5.4 04/22/2016   MPG 108 04/22/2016   Lab Results  Component Value Date   PROLACTIN 22.5 04/22/2016   Lab Results  Component Value Date   CHOL 148 04/22/2016   TRIG 144 04/22/2016   HDL 43 04/22/2016   CHOLHDL 3.4 04/22/2016   VLDL 29 04/22/2016   LDLCALC 76 04/22/2016    Physical Findings: AIMS: Facial and Oral Movements Muscles of Facial Expression: None, normal Lips and Perioral Area: None, normal Jaw: None, normal Tongue: None, normal,Extremity Movements Upper (arms, wrists, hands, fingers): None, normal Lower (legs, knees, ankles, toes): None, normal, Trunk Movements Neck, shoulders, hips: None, normal, Overall Severity Severity of abnormal movements (highest score from questions above): None, normal Incapacitation due to abnormal movements: None, normal Patient's awareness of abnormal movements (rate only patient's report): No Awareness, Dental Status Current problems with teeth and/or dentures?: No Does patient usually wear dentures?: No  CIWA:    COWS:     Musculoskeletal: Strength & Muscle Tone: within normal limits Gait & Station: normal Patient leans: N/A  Psychiatric Specialty Exam: Physical Exam  Nursing note and vitals reviewed.   Review of  Systems  Respiratory: Positive for cough.   Psychiatric/Behavioral: The patient is nervous/anxious.   All other systems reviewed and are negative.   Blood pressure 139/77, pulse 97, temperature 97.9 F (36.6 C), temperature source Oral, resp. rate 18, height 5' 2.25" (1.581 m), weight 75.3 kg (166 lb).Body mass index is 30.12 kg/m.  General Appearance: Casual  Eye Contact:  Fair  Speech:  Pressured improving  Volume:  Normal  Mood:  Anxious and Depressed  Affect:  Labile and Tearful on and off , improving  Thought Process:  Coherent and Descriptions of Associations: Circumstantial  Orientation:  Full (Time, Place, and Person)  Thought Content:  Rumination  Suicidal Thoughts:  No  Homicidal Thoughts:  No  Memory:  Immediate;   Fair Recent;   Fair Remote;   Fair  Judgement:  Fair  Insight:  Shallow  Psychomotor Activity:  Normal  Concentration:  Concentration: Fair and Attention Span: Fair  Recall:  [employer] of Knowledge:  Fair  Language:  Fair  Akathisia:  No  Handed:  Right  AIMS (if indicated):     Assets:  Desire for Improvement  ADL's:  Intact  Cognition:  WNL  Sleep:  Number of Hours: 6     Treatment Plan Summary:Patient today seen as preoccupied with her cough and nasal congestion, her speech is less pressured and she feels her mood is improving. Continue treatment. Bipolar disorder, curr episode mixed, severe, with psychotic features (HCC) improving  Will continue today 04/23/16  plan as below except where it is noted.    Daily contact with patient to assess and evaluate symptoms and progress in treatment, Medication management and Plan see below  For mood UE:[id number] Tegretol XR 100 mg po bid - reviewed.tegretol level tomorrow 04/24/16. For mood sx/psychosis: Abilify 5 mg po qhs.patient does not want any dose increase today since she feels overmedicated. For insomnia: Trazodone 100 mg po qhs. For cannabis abuse: continue to provide substance abuse  counseling. For nasal  congestion: likely seasonal allergies - add albuterol nebulization, robitussin po prn for cough,  start Claritin 10 mg po daily, flonase spray daily. Labs reviewed - hep panel - negative , RPR - negative , HIV - NR, pl - wnl, hba1c- wnl. EKG reviewed - qtc wnl. CSW will continue to work on disposition. Patient to continue to participate in groups and milieu.  Shaquela Weichert, MD 04/23/2016, 2:06 PM

## 2016-04-23 NOTE — Progress Notes (Signed)
Writer has observed patient up in the dayroom watching tv and occasionally interacting with peers. She was compliant with her medications and is looking forward to discharge on tomorrow. She denies si/hi/a/v hallucinations. Support given and safety maintained on uni with 15 min checks.

## 2016-04-23 NOTE — Progress Notes (Signed)
Recreation Therapy Notes  Date: 04/23/26 Time: 1000 Location: [address] Dayroom  Group Topic: Communication, Team Building, Problem Solving  Goal Area(s) Addresses:  Patient will effectively work with peer towards shared goal.  Patient will identify skill used to make activity successful.  Patient will identify how skills used during activity can be used to reach post d/c goals.   Behavioral Response:  Minimal  Intervention: STEM Activity   Activity: [employer] Tower. In teams, patients were asked to build the tallest freestanding tower possible out of 15 pipe cleaners. Systematically resources were removed, for example patient ability to use both hands and patient ability to verbally communicate.    Education:Social Skills, Discharge Planning.   Education Outcome: Acknowledges education/In group clarification offered/Needs additional education.   Clinical Observations/Feedback: Pt stated the tools used from the group (communication, teamwork) will "help you work with each other instead of against each other."  Pt left with doctor and did not return.  Caroll RancherMarjette Ailyne Pawley, LRT/CTRS     Caroll RancherLindsay, Lacretia Tindall A 04/23/2016 12:05 PM

## 2016-04-24 ENCOUNTER — Encounter (HOSPITAL_COMMUNITY): Payer: Self-pay | Admitting: Registered Nurse

## 2016-04-24 DIAGNOSIS — Z818 Family history of other mental and behavioral disorders: Secondary | ICD-10-CM

## 2016-04-24 DIAGNOSIS — R0981 Nasal congestion: Secondary | ICD-10-CM

## 2016-04-24 DIAGNOSIS — F17210 Nicotine dependence, cigarettes, uncomplicated: Secondary | ICD-10-CM

## 2016-04-24 LAB — CARBAMAZEPINE LEVEL, TOTAL: CARBAMAZEPINE LVL: 2.7 ug/mL — AB (ref 4.0–12.0)

## 2016-04-24 MED ORDER — LORATADINE 10 MG PO TABS: 10.0000 mg | ORAL_TABLET | Freq: Every day | ORAL | Status: AC

## 2016-04-24 MED ORDER — TRAZODONE HCL 100 MG PO TABS: 100.0000 mg | ORAL_TABLET | Freq: Every day | ORAL | 0 refills | Status: DC

## 2016-04-24 MED ORDER — CARBAMAZEPINE ER 100 MG PO TB12: 100.0000 mg | ORAL_TABLET | Freq: Two times a day (BID) | ORAL | 0 refills | Status: AC

## 2016-04-24 MED ORDER — BENZTROPINE MESYLATE 0.5 MG PO TABS: 0.5000 mg | ORAL_TABLET | Freq: Every day | ORAL | 0 refills | Status: AC

## 2016-04-24 MED ORDER — GUAIFENESIN-DM 100-10 MG/5ML PO SYRP: 5.0000 mL | ORAL_SOLUTION | ORAL | 0 refills | Status: DC | PRN

## 2016-04-24 MED ORDER — FLUTICASONE PROPIONATE 50 MCG/ACT NA SUSP: 2.0000 | Freq: Every day | NASAL | 0 refills | Status: AC

## 2016-04-24 MED ORDER — ARIPIPRAZOLE 5 MG PO TABS: 5.0000 mg | ORAL_TABLET | Freq: Every day | ORAL | 0 refills | Status: AC

## 2016-04-24 MED ORDER — BENZONATATE 200 MG PO CAPS
200.0000 mg | ORAL_CAPSULE | Freq: Three times a day (TID) | ORAL | 0 refills | Status: DC | PRN
Start: 2016-04-24 — End: 2017-02-09
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
## 2016-04-24 NOTE — Discharge Summary (Signed)
Physician Discharge Summary Note  Patient:  Hayley Buckley is an 51 y.o., female MRN:  [id number] DOB:  06/10/1965 Patient phone:  [phone] (home)  Patient address:   [address] [place] [address],  Total Time spent with patient: 30 minutes  Date of Admission:  04/20/2016 Date of Discharge: 025/24/18  Reason for Admission:  Suicidal and homicidal ideation who appeared to be manic  Principal Problem: Bipolar disorder, curr episode mixed, severe, with psychotic features [facility]) Discharge Diagnoses: Patient Active Problem List   Diagnosis Date Noted  . Nasal congestion [R09.81] 04/22/2016  . Bipolar disorder, curr episode mixed, severe, with psychotic features (HCC) [F31.64] 04/21/2016  . Cannabis use disorder, moderate, dependence (HCC) [F12.20] 04/21/2016    Past Psychiatric History: IP admission to [facility], [facility], [facility] . Pt reports she is noncompliant on medications , also her outpatient follow ups.  Past Medical History:  Past Medical History:  Diagnosis Date  . Esophageal abnormality   . Migraine   . Ovarian cyst   . Panic attack   . Thyroid disease     Past Surgical History:  Procedure Laterality Date  . CHOLECYSTECTOMY    . gastrectomy tube     Family History:  Family History  Problem Relation Age of Onset  . Alzheimer's disease Mother   . Diabetes Mother   . Lung disease Father   . Bipolar disorder Father   . Heart attack Brother   . Diabetes Other   . Heart attack Other   . Cancer Other   . ALS Other    Family Psychiatric  History: Father/Bipolar disorder Social History:  History  Alcohol Use  . Yes    Comment: social     History  Drug Use No    Social History   Social History  . Marital Buckley: Widowed    Spouse name: N/A  . Number of children: N/A  . Years of education: N/A   Social History Main Topics  . Smoking Buckley: Current Every Day Smoker    Packs/day: 0.50    Types: Cigarettes  . Smokeless tobacco: Never Used   . Alcohol use Yes     Comment: social  . Drug use: No  . Sexual activity: Yes    Birth control/ protection: None   Other Topics Concern  . None   Social History Narrative  . None    Hospital Course:  Hayley Buckley was admitted for Bipolar disorder, curr episode mixed, severe, with psychotic features (HCC) ; suicidal/homicidal ideation; and crisis management.  She was treated with the following medications Abilify5 mg daily, Trazodone 100 mg at bed time as needed; Tegretol XR 100 mg twice daily and Cogentin 0.5 mg at bed tme.  Hayley Buckley was discharged with current medication and was instructed on how to take medications as prescribed; (details listed below under Medication List).  Medical problems were identified and treated as needed.  Home medications were restarted as appropriate.  Improvement was monitored by observation and Hayley Buckley daily report of symptom reduction.  Hayley Buckley was monitored by daily self-inventory reports completed by Hayley Buckley and clinical staff.         Hayley Buckley was evaluated by the treatment team for stability and plans for continued recovery upon discharge.  Hayley Buckley motivation was an integral factor for scheduling further treatment.  Employment, transportation, bed availability, health Buckley, family support, and any pending legal issues were also considered during her hospital stay.  She was  offered further treatment options upon discharge including but not limited to Residential, Intensive Outpatient, and Outpatient treatment.  Shaniqwa Hayley GuthrieGoodell will follow up with the services as listed below under Follow Up Information.     Upon completion of this admission the Mikenzie Hayley GuthrieGoodell was both mentally and medically stable for discharge denying suicidal/homicidal ideation, auditory/visual/tactile hallucinations, delusional thoughts and paranoia.      Physical Findings: AIMS: Facial and Oral Movements Muscles of Facial Expression: None, normal Lips  and Perioral Area: None, normal Jaw: None, normal Tongue: None, normal,Extremity Movements Upper (arms, wrists, hands, fingers): None, normal Lower (legs, knees, ankles, toes): None, normal, Trunk Movements Neck, shoulders, hips: None, normal, Overall Severity Severity of abnormal movements (highest score from questions above): None, normal Incapacitation due to abnormal movements: None, normal Patient's awareness of abnormal movements (rate only patient's report): No Awareness, Dental Buckley Current problems with teeth and/or dentures?: No Does patient usually wear dentures?: No  CIWA:    COWS:     Musculoskeletal: Strength & Muscle Tone: within normal limits Gait & Station: normal Patient leans: N/A  Psychiatric Specialty Exam:See suicide risk assessment Physical Exam  ROS  Blood pressure 138/75, pulse 93, temperature 97.6 F (36.4 C), temperature source Oral, resp. rate 20, height 5' 2.25" (1.581 m), weight 75.3 kg (166 lb).Body mass index is 30.12 kg/m.    Have you used any form of tobacco in the last 30 days? (Cigarettes, Smokeless Tobacco, Cigars, and/or Pipes): Yes  Has this patient used any form of tobacco in the last 30 days? (Cigarettes, Smokeless Tobacco, Cigars, and/or Pipes) Yes, Yes, A prescription for an FDA-approved tobacco cessation medication was offered at discharge and the patient refused  Blood Alcohol level:  Lab Results  Component Value Date   [facility] 04/[facility]2016    Metabolic Disorder Labs:  Lab Results  Component Value Date   HGBA1C 5.4 04/22/2016   MPG 108 04/22/2016   Lab Results  Component Value Date   PROLACTIN 22.5 04/22/2016   Lab Results  Component Value Date   CHOL 148 04/22/2016   TRIG 144 04/22/2016   HDL 43 04/22/2016   CHOLHDL 3.4 04/22/2016   VLDL 29 04/22/2016   LDLCALC 76 04/22/2016    See Psychiatric Specialty Exam and Suicide Risk Assessment completed by Attending Physician prior to discharge.  Discharge destination:   Home  Is patient on multiple antipsychotic therapies at discharge:  No   Has Patient had three or more failed trials of antipsychotic monotherapy by history:  No  Recommended Plan for Multiple Antipsychotic Therapies: NA  Discharge Instructions    Diet - low sodium heart healthy    Complete by:  As directed    Discharge instructions    Complete by:  As directed    Take all of you medications as prescribed by your mental healthcare provider.  Report any adverse effects and reactions from your medications to your outpatient provider promptly.  Do not engage in alcohol and or illegal drug use while on prescription medicines. Keep all scheduled appointments. This is to ensure that you are getting refills on time and to avoid any interruption in your medication.  If you are unable to keep an appointment call to reschedule.  Be sure to follow up with resources and follow ups given. In the event of worsening symptoms call the crisis hotline, 911, and or go to the nearest emergency department for appropriate evaluation and treatment of symptoms. Follow-up with your primary care provider for your medical issues,  concerns and or health care needs.   Increase activity slowly    Complete by:  As directed      Allergies as of 04/24/2016   No Known Allergies     Medication List    STOP taking these medications   acetaminophen 500 MG tablet Commonly known as:  TYLENOL   polyethylene glycol packet Commonly known as:  MIRALAX / GLYCOLAX     TAKE these medications     Indication  ALEVE 220 MG tablet Generic drug:  naproxen sodium Take 220 mg by mouth daily as needed (for pain).  Indication:  Pain   ARIPiprazole 5 MG tablet Commonly known as:  ABILIFY Take 1 tablet (5 mg total) by mouth at bedtime.  Indication:  bipo;lar disorder   benzonatate 200 MG capsule Commonly known as:  TESSALON Take 1 capsule (200 mg total) by mouth 3 (three) times daily as needed for cough.  Indication:   Cough   benztropine 0.5 MG tablet Commonly known as:  COGENTIN Take 1 tablet (0.5 mg total) by mouth at bedtime.  Indication:  Extrapyramidal Reaction caused by Medications   carbamazepine 100 MG 12 hr tablet Commonly known as:  TEGRETOL XR Take 1 tablet (100 mg total) by mouth 2 (two) times daily.  Indication:  mood stabilization   fluticasone 50 MCG/ACT nasal spray Commonly known as:  FLONASE Place 2 sprays into both nostrils daily. Start taking on:  04/25/2016  Indication:  Allergic Rhinitis   guaiFENesin-dextromethorphan 100-10 MG/5ML syrup Commonly known as:  ROBITUSSIN DM Take 5 mLs by mouth every 4 (four) hours as needed for cough.  Indication:  Cough   loratadine 10 MG tablet Commonly known as:  CLARITIN Take 1 tablet (10 mg total) by mouth daily. Over the counter Start taking on:  04/25/2016  Indication:  Chronic Nonallergic Rhinitis   naproxen 500 MG tablet Commonly known as:  NAPROSYN Take 1 tablet (500 mg total) by mouth 2 (two) times daily with a meal.  Indication:  Pain   traZODone 100 MG tablet Commonly known as:  DESYREL Take 1 tablet (100 mg total) by mouth at bedtime.  Indication:  Trouble Sleeping      Follow-up Information    [facility] Follow up.   Why:    Appointment is Tuesday, April 27, 2016 at 2:00pm.  Be sure to bring proof of income (if any) and photo ID.  Contact information: [address] [place] [address] [phone]           Follow-up recommendations:  Activity:  As tolerated Diet:  As tolerated  Comments:  Shequila Hayley Buckley has been instructed to take medications as prescribed; and report adverse effects to outpatient provider.  Follow up with primary doctor for any medical issues and If symptoms recur report to nearest emergency or crisis hot line.    SignedAssunta Found, NP 04/24/2016, 9:15 AM

## 2016-04-24 NOTE — Progress Notes (Signed)
  [facility] Dis[facility] :  Will you be returning to the same living situation after discharge:  Yes,  with boyfriend At discharge, do you have transportation home?: Yes,  arranged Do you have the ability to pay for your medications: No.  Release of information consent forms completed and in the chart;  Patient's signature needed at discharge.  Patient to Follow up at: Follow-up Information    [facility] Follow up.   Why:    Appointment is Tuesday, April 27, 2016 at 2:00pm.  Be sure to bring proof of income (if any) and photo ID.  Contact information: [address] [place] [address] [phone]           Next level of care provider [facility]:no  Safety Planning and Suicide Prevention discussed: No.  Patient refused  Have you used any form of tobacco in the last 30 days? (Cigarettes, Smokeless Tobacco, Cigars, and/or Pipes): Yes  Has patient been referred to the Quitline?: Patient refused referral  Patient has been referred for addiction treatment: N/A  Lynnell ChadMareida J Grossman-Orr 04/24/2016, 9:17 AM

## 2016-04-24 NOTE — Progress Notes (Signed)
Patient hopeful to be discharged today. Interacted with peers and staff, denies SI and HI, also denies any hallucinations or perceptual abnormalities. Speech clear and goal oriented. Compliant with medications. Reports that chest congestion is resolving.

## 2016-04-24 NOTE — [facility] Suicide Risk Assessment (Addendum)
[facility]   Principal Problem: Bipolar disorder, curr episode mixed, severe, with psychotic features [facility](HCC) Discharge Diagnoses:  Patient Active Problem List   Diagnosis Date Noted  . Nasal congestion [R09.81] 04/22/2016  . Bipolar disorder, curr episode mixed, severe, with psychotic features (HCC) [F31.64] 04/21/2016  . Cannabis use disorder, moderate, dependence (HCC) [F12.20] 04/21/2016    Total Time spent with patient: 30 minutes  Musculoskeletal: Strength & Muscle Tone: within normal limits Gait & Station: normal Patient leans: N/A  Psychiatric Specialty Exam: ROS no headache, no chest pain, no shortness of breath, no vomiting   Blood pressure 138/75, pulse 93, temperature 97.6 F (36.4 C), temperature source Oral, resp. rate 20, height 5' 2.25" (1.581 m), weight 75.3 kg (166 lb).Body mass index is 30.12 kg/m.  General Appearance: Well Groomed  Eye Contact::  Good  Speech:  Normal Rate409  Volume:  Normal  Mood:  improved - denies depression, states mood is " good"  Affect:  appropriate, reactive  Thought Process:  Linear  Orientation:  Full (Time, Place, and Person)  Thought Content:  no hallucinations, no delusions, not internally preoccupied   Suicidal Thoughts:  No denies any suicidal or self injurious ideations   Homicidal Thoughts:  No denies any homicidal ideations   Memory:  recent and remote grossly intact   Judgement:  Other:  improved   Insight:  improved   Psychomotor Activity:  Normal  Concentration:  Good  Recall:  Good  Fund of Knowledge:Good  Language: Good  Akathisia:  Negative  Handed:  Right  AIMS (if indicated):     Assets:  Communication Skills Desire for Improvement Resilience  Sleep:  Number of Hours: 6.75  Cognition: WNL  ADL's:  Intact   Mental Status Per Nursing Assessment::   On Admission:  Suicidal ideation indicated by patient, Suicidal ideation indicated by others, Self-harm thoughts, Self-harm  behaviors, Thoughts of violence towards others  Demographic Factors:  51 year old female , lives with boyfriend, no children, currently unemployed   Loss Factors: Financial difficulties   Historical Factors: Prior psychiatric admissions, has been diagnosed with Bipolar Disorder   Risk Reduction Factors:   Living with another person, especially a relative, Positive social support and Positive coping skills or problem solving skills  Continued Clinical Symptoms:  Patient presents alert, attentive, well related, mood is described as improved, denies feeling depressed at this time, affect reactive, no thought disorder, no suicidal or self injurious ideations, no hallucinations, no delusions. Future oriented . Denies medication side effects. Of note, carbamazepine serum level is low ( 2.7) - we discussed- she does not want to increase dose at this time - states " I like the dose I am on now, it helps, and I think if I go higher I will get side effects"  Cognitive Features That Contribute To Risk:  No gross cognitive deficits noted upon discharge. Is alert , attentive, and oriented x 3   Suicide Risk:  Mild:  Suicidal ideation of limited frequency, intensity, duration, and specificity.  There are no identifiable plans, no associated intent, mild dysphoria and related symptoms, good self-control (both objective and subjective assessment), few other risk factors, and identifiable protective factors, including available and accessible social support.  Follow-up Information    [facility] Follow up.   Why:    Appointment is Tuesday, April 27, 2016 at 2:00pm.  Be sure to bring proof of income (if any) and photo ID.  Contact information: [address] [place]  [place] [address] [phone]           Plan Of Care/Follow-up recommendations:  Activity:  as tolerated  Diet:  Regular Tests:  NA Other:  see below Patient is leaving unit in good spirits  Plans to return home Follow up  as above   Nehemiah Massed, MD 04/24/2016, 9:59 AM

## 2017-02-01 ENCOUNTER — Emergency Department (HOSPITAL_COMMUNITY)
Admission: EM | Admit: 2017-02-01 | Discharge: 2017-02-01 | Disposition: A | Discharge status: Home / Self Care | Payer: Self-pay | Attending: Emergency Medicine | Admitting: Emergency Medicine

## 2017-02-01 ENCOUNTER — Other Ambulatory Visit: Payer: Self-pay

## 2017-02-01 ENCOUNTER — Encounter (HOSPITAL_COMMUNITY): Payer: Self-pay | Admitting: *Deleted

## 2017-02-01 DIAGNOSIS — B850 Pediculosis due to Pediculus humanus capitis: Secondary | ICD-10-CM

## 2017-02-01 DIAGNOSIS — Z79899 Other long term (current) drug therapy: Secondary | ICD-10-CM | POA: Insufficient documentation

## 2017-02-01 DIAGNOSIS — B852 Pediculosis, unspecified: Secondary | ICD-10-CM | POA: Insufficient documentation

## 2017-02-01 DIAGNOSIS — F17210 Nicotine dependence, cigarettes, uncomplicated: Secondary | ICD-10-CM | POA: Insufficient documentation

## 2017-02-01 MED ORDER — HYDROXYZINE HCL 25 MG PO TABS
50.0000 mg | ORAL_TABLET | Freq: Once | ORAL | Status: AC
  Administered 2017-02-01: 50 mg via ORAL
  Filled 2017-02-01: qty 2

## 2017-02-01 MED ORDER — TRAZODONE HCL 50 MG PO TABS: 50.0000 mg | ORAL_TABLET | Freq: Every day | ORAL | 0 refills | Status: AC

## 2017-02-01 MED ORDER — HYDROXYZINE HCL 25 MG PO TABS: 25.0000 mg | ORAL_TABLET | Freq: Four times a day (QID) | ORAL | 0 refills | Status: AC

## 2017-02-01 MED ORDER — MALATHION 0.5 % EX LOTN: TOPICAL_LOTION | Freq: Once | CUTANEOUS | 1 refills | Status: AC

## 2017-02-01 NOTE — ED Provider Notes (Signed)
[facility]GENCY [facility]PART[facility] Provider Note   CSN: [id number] Arrival date & time: 02/01/17  1228     History   Chief Complaint Chief Complaint  Patient presents with  . Rash    HPI Hayley Buckley is a 51 y.o. female presenting with scalp and neck pruritis and rash along with visible insects which has not improved with otc permethrin treatment (generic [employer] brand Nix) x 2 since July.  She endorses she and husband were given furniture by a church including a mattress and there is concern over infestation of these items.  Husband however has no symptoms.  They had an exterminator out this morning who found small bugs in the mattress but stated they were not "bed bugs", suspects they are "carpet beetles". However, they are currently awaiting home fumigation treatment. She endorses severe itching not relieved by benadryl but also states she is out of her qhs trazodone so is currently lying awake scratching all night. She denies fevers, chills or other complaint.  Husband denies any symptoms.  The history is provided by the patient and the spouse.    Past Medical History:  Diagnosis Date  . Esophageal abnormality   . Migraine   . Ovarian cyst   . Panic attack   . Thyroid disease     Patient Active Problem List   Diagnosis Date Noted  . Nasal congestion 04/22/2016  . Bipolar disorder, curr episode mixed, severe, with psychotic features (HCC) 04/21/2016  . Cannabis use disorder, moderate, dependence (HCC) 04/21/2016    Past Surgical History:  Procedure Laterality Date  . CHOLECYSTECTOMY    . gastrectomy tube      OB History    Gravida Para Term Preterm AB Living             0   SAB TAB Ectopic Multiple Live Births                   Home Medications    Prior to Admission medications   Medication Sig Start Date End Date Taking? Authorizing Provider  ARIPiprazole (ABILIFY) 5 MG tablet Take 1 tablet (5 mg total) by mouth at bedtime. 04/24/16   Rankin, Shuvon B, NP    benzonatate (TESSALON) 200 MG capsule Take 1 capsule (200 mg total) by mouth 3 (three) times daily as needed for cough. 04/24/16   Rankin, Shuvon B, NP  benztropine (COGENTIN) 0.5 MG tablet Take 1 tablet (0.5 mg total) by mouth at bedtime. 04/24/16   Rankin, Shuvon B, NP  carbamazepine (TEGRETOL XR) 100 MG 12 hr tablet Take 1 tablet (100 mg total) by mouth 2 (two) times daily. 04/24/16   Rankin, Shuvon B, NP  fluticasone (FLONASE) 50 MCG/ACT nasal spray Place 2 sprays into both nostrils daily. 04/25/16   Rankin, Shuvon B, NP  guaiFENesin-dextromethorphan (ROBITUSSIN DM) 100-10 MG/5ML syrup Take 5 mLs by mouth every 4 (four) hours as needed for cough. 04/24/16   Rankin, Shuvon B, NP  hydrOXYzine (ATARAX/VISTARIL) 25 MG tablet Take 1 tablet (25 mg total) by mouth every 6 (six) hours. 02/01/17   Burgess AmorIdol, Ansel Ferrall, PA-C  loratadine (CLARITIN) 10 MG tablet Take 1 tablet (10 mg total) by mouth daily. Over the counter 04/25/16   Rankin, Shuvon B, NP  malathion (OVIDE) 0.5 % lotion Apply topically once for 1 dose. Sprinkle lotion on dry hair and rub gently until the scalp is thoroughly moistened. Allow to dry naturally and leave uncovered.  Repeat treatment in 10 days. 02/01/17 02/01/17  Burgess AmorIdol, Wrenly Lauritsen,  PA-C  naproxen (NAPROSYN) 500 MG tablet Take 1 tablet (500 mg total) by mouth 2 (two) times daily with a meal. Patient not taking: Reported on 04/17/2016 03/29/16   Triplett, Tammy, PA-C  naproxen sodium (ALEVE) 220 MG tablet Take 220 mg by mouth daily as needed (for pain).    [provider]  traZODone (DESYREL) 50 MG tablet Take 1 tablet (50 mg total) by mouth at bedtime. 02/01/17   Burgess Amor, PA-C    Family History Family History  Problem Relation Age of Onset  . Alzheimer's disease Mother   . Diabetes Mother   . Lung disease Father   . Bipolar disorder Father   . Heart attack Brother   . Diabetes Other   . Heart attack Other   . Cancer Other   . ALS Other     Social History Social History    Tobacco Use  . Smoking status: Current Every Day Smoker    Packs/day: 0.50    Types: Cigarettes  . Smokeless tobacco: Never Used  Substance Use Topics  . Alcohol use: Yes    Comment: social  . Drug use: No     Allergies   Patient has no known allergies.   Review of Systems Review of Systems  Constitutional: Negative for chills and fever.  Respiratory: Negative for shortness of breath and wheezing.   Skin: Positive for rash.  Neurological: Negative for numbness.  Psychiatric/Behavioral: Positive for sleep disturbance.     Physical Exam Updated Vital Signs BP (!) 154/97   Pulse 77   Temp 98.9 F (37.2 C)   Resp 20   Ht 5\' 4"  (1.626 m)   Wt 69.9 kg (154 lb)   LMP 01/11/2017 (Approximate)   SpO2 99%   BMI 26.43 kg/m   Physical Exam  Constitutional: She appears well-developed and well-nourished. No distress.  HENT:  Head: Normocephalic.  Neck: Neck supple.  Cardiovascular: Normal rate.  Pulmonary/Chest: Effort normal. She has no wheezes.  Musculoskeletal: Normal range of motion. She exhibits no edema.  Skin: Rash noted.  Macular excoriated dry appearing rash on posterior neck.  Patient also has multiple areas of nits attached to her hair follicles.  I was unable to find any active moving head lice.     ED Treatments / Results  Labs (all labs ordered are listed, but only abnormal results are displayed) Labs Reviewed - No data to display  EKG  EKG Interpretation None       Radiology No results found.  Procedures Procedures (including critical care time)  Medications Ordered in ED Medications  hydrOXYzine (ATARAX/VISTARIL) tablet 50 mg (50 mg Oral Given 02/01/17 1443)     Initial Impression / Assessment and Plan / ED Course  I have reviewed the triage vital signs and the nursing notes.  Pertinent labs & imaging results that were available during my care of the patient were reviewed by me and considered in my medical decision making (see chart  for details).     Patient with multiple treatments with permethrin which has not resolved her symptoms.  Initially would have presumed her symptoms were factitious, especially given her husband's lack of symptoms but she definitely has nits on exam of her scalp.  Will try a round of Ovide lotion with retreatment in 10 days.  She was prescribed hydroxyzine in place of Benadryl as needed itching.  She was also given a small quantity of trazodone for nightly use.  She is currently awaiting [facility] with  [facility], encouraged to proceed with this plan.  Discussed vacuuming/deep cleaning all upholstered fabrics in her home, washing clothing try to eliminate the source.    Final Clinical Impressions(s) / ED Diagnoses   Final diagnoses:  Lice infested hair    ED Discharge Orders        Ordered    malathion (OVIDE) 0.5 % lotion   Once     02/01/17 1501    hydrOXYzine (ATARAX/VISTARIL) 25 MG tablet  Every 6 hours     02/01/17 1501    traZODone (DESYREL) 50 MG tablet  Daily at bedtime     02/01/17 1501       Burgess Amordol, Hema Lanza, PA-C 02/01/17 1622    Rolland PorterJames, Mark, MD 02/02/17 2248

## 2017-02-01 NOTE — Discharge Instructions (Signed)
Apply this medicine to your scalp following the instructions.  Reapply in 10 days.  I recommend treating your home as you are planning, also washing your clothing and deep vacuuming any upholstered furniture may help get rid of this.

## 2017-02-01 NOTE — ED Triage Notes (Signed)
States she has lice or bed bug infestation, since July 18, states she hash treated herself with OTC meds with some relief

## 2017-02-08 DIAGNOSIS — Z79899 Other long term (current) drug therapy: Secondary | ICD-10-CM | POA: Insufficient documentation

## 2017-02-08 DIAGNOSIS — I10 Essential (primary) hypertension: Secondary | ICD-10-CM | POA: Insufficient documentation

## 2017-02-08 DIAGNOSIS — M542 Cervicalgia: Secondary | ICD-10-CM | POA: Insufficient documentation

## 2017-02-08 DIAGNOSIS — F17210 Nicotine dependence, cigarettes, uncomplicated: Secondary | ICD-10-CM | POA: Insufficient documentation

## 2017-02-09 ENCOUNTER — Emergency Department (HOSPITAL_COMMUNITY)
Admission: EM | Admit: 2017-02-09 | Discharge: 2017-02-09 | Disposition: A | Discharge status: Home / Self Care | Payer: Self-pay | Attending: Emergency Medicine | Admitting: Emergency Medicine

## 2017-02-09 ENCOUNTER — Other Ambulatory Visit: Payer: Self-pay

## 2017-02-09 ENCOUNTER — Encounter (HOSPITAL_COMMUNITY): Payer: Self-pay | Admitting: *Deleted

## 2017-02-09 DIAGNOSIS — F419 Anxiety disorder, unspecified: Secondary | ICD-10-CM

## 2017-02-09 HISTORY — DX: Dissociative identity disorder: F44.81

## 2017-02-09 HISTORY — DX: Bipolar disorder, unspecified: F31.9

## 2017-02-09 HISTORY — DX: Major depressive disorder, single episode, unspecified: F32.9

## 2017-02-09 HISTORY — DX: Essential (primary) hypertension: I10

## 2017-02-09 HISTORY — DX: Depression, unspecified: F32.A

## 2017-02-09 HISTORY — DX: Insomnia, unspecified: G47.00

## 2017-02-09 LAB — RAPID URINE DRUG SCREEN, HOSP PERFORMED
AMPHETAMINES: NOT DETECTED
BARBITURATES: NOT DETECTED
BENZODIAZEPINES: NOT DETECTED
Cocaine: NOT DETECTED
Opiates: NOT DETECTED
TETRAHYDROCANNABINOL: NOT DETECTED

## 2017-02-09 LAB — CBC WITH DIFFERENTIAL/PLATELET
Basophils Absolute: 0 10*3/uL (ref 0.0–0.1)
Basophils Relative: 0 %
Eosinophils Absolute: 0.1 10*3/uL (ref 0.0–0.7)
Eosinophils Relative: 2 %
HEMATOCRIT: 36.7 % (ref 36.0–46.0)
HEMOGLOBIN: 12.5 g/dL (ref 12.0–15.0)
LYMPHS ABS: 2 10*3/uL (ref 0.7–4.0)
LYMPHS PCT: 33 %
MCH: 31.7 pg (ref 26.0–34.0)
MCHC: 34.1 g/dL (ref 30.0–36.0)
MCV: 93.1 fL (ref 78.0–100.0)
MONOS PCT: 4 %
Monocytes Absolute: 0.3 10*3/uL (ref 0.1–1.0)
NEUTROS ABS: 3.8 10*3/uL (ref 1.7–7.7)
NEUTROS PCT: 61 %
Platelets: 206 10*3/uL (ref 150–400)
RBC: 3.94 MIL/uL (ref 3.87–5.11)
RDW: 13 % (ref 11.5–15.5)
WBC: 6.1 10*3/uL (ref 4.0–10.5)

## 2017-02-09 LAB — COMPREHENSIVE METABOLIC PANEL
ALBUMIN: 4 g/dL (ref 3.5–5.0)
ALT: 12 U/L — ABNORMAL LOW (ref 14–54)
ANION GAP: 7 (ref 5–15)
AST: 16 U/L (ref 15–41)
Alkaline Phosphatase: 56 U/L (ref 38–126)
BUN: 11 mg/dL (ref 6–20)
CHLORIDE: 101 mmol/L (ref 101–111)
CO2: 27 mmol/L (ref 22–32)
Calcium: 8.4 mg/dL — ABNORMAL LOW (ref 8.9–10.3)
Creatinine, Ser: 0.63 mg/dL (ref 0.44–1.00)
GFR calc non Af Amer: >60 mL/min (ref >60)
GLUCOSE: 112 mg/dL — AB (ref 65–99)
Potassium: 3.7 mmol/L (ref 3.5–5.1)
SODIUM: 135 mmol/L (ref 135–145)
Total Bilirubin: 0.7 mg/dL (ref 0.3–1.2)
Total Protein: 7.2 g/dL (ref 6.5–8.1)

## 2017-02-09 LAB — ETHANOL

## 2017-02-09 LAB — PREGNANCY, URINE: PREG TEST UR: NEGATIVE

## 2017-02-09 MED ORDER — HYDROXYZINE HCL 25 MG PO TABS
25.0000 mg | ORAL_TABLET | Freq: Four times a day (QID) | ORAL | Status: DC
  Administered 2017-02-09 (×2): 25 mg via ORAL
  Filled 2017-02-09 (×2): qty 1

## 2017-02-09 MED ORDER — DIPHENHYDRAMINE HCL 25 MG PO CAPS
25.0000 mg | ORAL_CAPSULE | Freq: Once | ORAL | Status: AC
  Administered 2017-02-09: 25 mg via ORAL
  Filled 2017-02-09: qty 1

## 2017-02-09 MED ORDER — ARIPIPRAZOLE 10 MG PO TABS
ORAL_TABLET | ORAL | Status: AC
  Filled 2017-02-09: qty 1

## 2017-02-09 MED ORDER — BENZTROPINE MESYLATE 1 MG PO TABS
0.5000 mg | ORAL_TABLET | Freq: Every day | ORAL | Status: DC
  Administered 2017-02-09: 0.5 mg via ORAL
  Filled 2017-02-09: qty 1

## 2017-02-09 MED ORDER — CARBAMAZEPINE ER 100 MG PO TB12
100.0000 mg | ORAL_TABLET | Freq: Two times a day (BID) | ORAL | Status: DC
  Administered 2017-02-09 (×2): 100 mg via ORAL
  Filled 2017-02-09 (×7): qty 1

## 2017-02-09 MED ORDER — TRAZODONE HCL 50 MG PO TABS
50.0000 mg | ORAL_TABLET | Freq: Every day | ORAL | Status: DC
  Administered 2017-02-09: 50 mg via ORAL
  Filled 2017-02-09: qty 1

## 2017-02-09 MED ORDER — ARIPIPRAZOLE 5 MG PO TABS
5.0000 mg | ORAL_TABLET | Freq: Every day | ORAL | Status: DC
  Administered 2017-02-09: 5 mg via ORAL
  Filled 2017-02-09 (×3): qty 1

## 2017-02-09 NOTE — ED Notes (Signed)
TTS in progress 

## 2017-02-09 NOTE — ED Notes (Signed)
ED Provider at bedside. 
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
## 2017-02-09 NOTE — Progress Notes (Signed)
Per Assunta FoundShuvon Rankin, NP, the patient does not meet criteria for inpatient treatment. The patient is recommended for discharge and to follow up with an outpatient provider. CSW will provide additional resources for outpatient providers.   The patient is psychiatrically cleared at this time.   Resources faxed to [phone].    London SheerMandy Crews, RN notified.    Baldo DaubJolan Madiha Bambrick MSW, LCSWA CSW Disposition [phone]

## 2017-02-09 NOTE — ED Provider Notes (Signed)
1:47 PM Patient has been cleared by psychiatry. Has not had any SI/HI. States she is feeling much better. D/c home, f/u with PCP and psych   Pricilla LovelessGoldston, Nur Krasinski, MD 02/09/17 [phone]

## 2017-02-09 NOTE — ED Notes (Signed)
Ac notified of need for meds
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
## 2017-02-13 ENCOUNTER — Other Ambulatory Visit: Payer: Self-pay

## 2017-02-13 ENCOUNTER — Encounter (HOSPITAL_COMMUNITY): Payer: Self-pay | Admitting: Emergency Medicine

## 2017-02-13 ENCOUNTER — Emergency Department (HOSPITAL_COMMUNITY)
Admission: EM | Admit: 2017-02-13 | Discharge: 2017-02-14 | Disposition: A | Discharge status: Home / Self Care | Payer: Self-pay | Attending: Emergency Medicine | Admitting: Emergency Medicine

## 2017-02-13 DIAGNOSIS — Z79899 Other long term (current) drug therapy: Secondary | ICD-10-CM | POA: Insufficient documentation

## 2017-02-13 DIAGNOSIS — E876 Hypokalemia: Secondary | ICD-10-CM | POA: Insufficient documentation

## 2017-02-13 DIAGNOSIS — F319 Bipolar disorder, unspecified: Secondary | ICD-10-CM

## 2017-02-13 DIAGNOSIS — F17210 Nicotine dependence, cigarettes, uncomplicated: Secondary | ICD-10-CM | POA: Insufficient documentation

## 2017-02-13 DIAGNOSIS — F3164 Bipolar disorder, current episode mixed, severe, with psychotic features: Secondary | ICD-10-CM | POA: Diagnosis present

## 2017-02-13 DIAGNOSIS — I10 Essential (primary) hypertension: Secondary | ICD-10-CM | POA: Insufficient documentation

## 2017-02-13 LAB — CBC WITH DIFFERENTIAL/PLATELET
BASOS ABS: 0 10*3/uL (ref 0.0–0.1)
Basophils Relative: 0 %
EOS ABS: 0.1 10*3/uL (ref 0.0–0.7)
EOS PCT: 1 %
HCT: 39.6 % (ref 36.0–46.0)
Hemoglobin: 13.7 g/dL (ref 12.0–15.0)
LYMPHS ABS: 2 10*3/uL (ref 0.7–4.0)
LYMPHS PCT: 26 %
MCH: 31.6 pg (ref 26.0–34.0)
MCHC: 34.6 g/dL (ref 30.0–36.0)
MCV: 91.5 fL (ref 78.0–100.0)
MONO ABS: 0.5 10*3/uL (ref 0.1–1.0)
Monocytes Relative: 7 %
Neutro Abs: 5.2 10*3/uL (ref 1.7–7.7)
Neutrophils Relative %: 66 %
PLATELETS: 233 10*3/uL (ref 150–400)
RBC: 4.33 MIL/uL (ref 3.87–5.11)
RDW: 12.8 % (ref 11.5–15.5)
WBC: 7.8 10*3/uL (ref 4.0–10.5)

## 2017-02-13 LAB — COMPREHENSIVE METABOLIC PANEL
ALBUMIN: 4.5 g/dL (ref 3.5–5.0)
ALT: 16 U/L (ref 14–54)
ANION GAP: 9 (ref 5–15)
AST: 18 U/L (ref 15–41)
Alkaline Phosphatase: 64 U/L (ref 38–126)
BILIRUBIN TOTAL: 0.3 mg/dL (ref 0.3–1.2)
BUN: 8 mg/dL (ref 6–20)
CHLORIDE: 100 mmol/L — AB (ref 101–111)
CO2: 29 mmol/L (ref 22–32)
Calcium: 9.4 mg/dL (ref 8.9–10.3)
Creatinine, Ser: 0.61 mg/dL (ref 0.44–1.00)
GFR calc Af Amer: >60 mL/min (ref >60)
GFR calc non Af Amer: >60 mL/min (ref >60)
GLUCOSE: 132 mg/dL — AB (ref 65–99)
POTASSIUM: 3.2 mmol/L — AB (ref 3.5–5.1)
SODIUM: 138 mmol/L (ref 135–145)
TOTAL PROTEIN: 7.8 g/dL (ref 6.5–8.1)

## 2017-02-13 LAB — RAPID URINE DRUG SCREEN, HOSP PERFORMED
AMPHETAMINES: NOT DETECTED
BARBITURATES: NOT DETECTED
BENZODIAZEPINES: NOT DETECTED
COCAINE: NOT DETECTED
Opiates: NOT DETECTED
Tetrahydrocannabinol: NOT DETECTED

## 2017-02-13 LAB — I-STAT BETA HCG BLOOD, ED (MC, WL, AP ONLY)

## 2017-02-13 LAB — ETHANOL: Alcohol, Ethyl (B): <10 mg/dL (ref <10)

## 2017-02-13 MED ORDER — POTASSIUM CHLORIDE CRYS ER 20 MEQ PO TBCR
40.0000 meq | EXTENDED_RELEASE_TABLET | Freq: Once | ORAL | Status: AC
  Administered 2017-02-13: 40 meq via ORAL
  Filled 2017-02-13: qty 2

## 2017-02-13 MED ORDER — CARBAMAZEPINE ER 100 MG PO TB12
ORAL_TABLET | ORAL | Status: AC
  Filled 2017-02-13: qty 1

## 2017-02-13 MED ORDER — BENZTROPINE MESYLATE 1 MG PO TABS
0.5000 mg | ORAL_TABLET | Freq: Every day | ORAL | Status: DC
  Administered 2017-02-13: 0.5 mg via ORAL
  Filled 2017-02-13: qty 1

## 2017-02-13 MED ORDER — ARIPIPRAZOLE 10 MG PO TABS
ORAL_TABLET | ORAL | Status: AC
  Filled 2017-02-13: qty 1

## 2017-02-13 MED ORDER — NICOTINE 21 MG/24HR TD PT24
21.0000 mg | MEDICATED_PATCH | Freq: Every day | TRANSDERMAL | Status: DC
  Administered 2017-02-13 – 2017-02-14 (×2): 21 mg via TRANSDERMAL
  Filled 2017-02-13 (×2): qty 1

## 2017-02-13 MED ORDER — TRAZODONE HCL 50 MG PO TABS
50.0000 mg | ORAL_TABLET | Freq: Every day | ORAL | Status: DC
  Administered 2017-02-13: 50 mg via ORAL
  Filled 2017-02-13: qty 1

## 2017-02-13 MED ORDER — CARBAMAZEPINE ER 100 MG PO TB12
100.0000 mg | ORAL_TABLET | Freq: Two times a day (BID) | ORAL | Status: DC
  Administered 2017-02-13 – 2017-02-14 (×2): 100 mg via ORAL
  Filled 2017-02-13 (×5): qty 1

## 2017-02-13 MED ORDER — ARIPIPRAZOLE 5 MG PO TABS
5.0000 mg | ORAL_TABLET | Freq: Every day | ORAL | Status: DC
  Administered 2017-02-13: 5 mg via ORAL
  Filled 2017-02-13 (×2): qty 1

## 2017-02-13 NOTE — ED Notes (Signed)
Pt requests that boyfriend not know information, boyfriends name is: Benita GutterRussel Mabe
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
## 2017-02-13 NOTE — ED Notes (Signed)
TTS consultant called via phone and recommends overnight evaluation and reevaluation tomorrow.
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
## 2017-02-14 DIAGNOSIS — R451 Restlessness and agitation: Secondary | ICD-10-CM

## 2017-02-14 DIAGNOSIS — F319 Bipolar disorder, unspecified: Secondary | ICD-10-CM

## 2017-02-14 DIAGNOSIS — G4700 Insomnia, unspecified: Secondary | ICD-10-CM

## 2017-02-14 DIAGNOSIS — R45850 Homicidal ideations: Secondary | ICD-10-CM

## 2017-02-14 DIAGNOSIS — Z810 Family history of intellectual disabilities: Secondary | ICD-10-CM

## 2017-02-14 DIAGNOSIS — Z818 Family history of other mental and behavioral disorders: Secondary | ICD-10-CM

## 2017-02-14 DIAGNOSIS — F17210 Nicotine dependence, cigarettes, uncomplicated: Secondary | ICD-10-CM

## 2017-02-14 MED ORDER — ACETAMINOPHEN 325 MG PO TABS
650.0000 mg | ORAL_TABLET | Freq: Once | ORAL | Status: AC
Start: 2017-02-14 — End: 2017-02-14
  Administered 2017-02-14: 650 mg via ORAL
  Filled 2017-02-14: qty 2

## 2017-02-14 NOTE — Progress Notes (Signed)
Per Fransisca KaufmannLaura Davis, NP, the patient does not meet criteria for inpatient treatment. The patient is recommended for discharge and to [facility].   The patient is psychiatrically cleared at this time.    Charge RN aware.  Baldo DaubJolan Sheletha Bow, MSW, LCSWA Clinical Social Worker (Disposition) [facility]  [phone]

## 2017-02-14 NOTE — ED Notes (Signed)
Pt in shower.  
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
## 2017-02-14 NOTE — Discharge Instructions (Signed)
Cleared by behavioral health for discharge home.  Follow-up as per behavioral health recommendations.

## 2017-02-14 NOTE — ED Notes (Addendum)
[name], fiance called and wanted to speak with her. Advised him she is sleeping at this time. He told me to tell her that he wanted her to come back home but she had to be put back on her meds and act right. Advised him I would tell her he called when she wakes

## 2017-02-14 NOTE — ED Provider Notes (Signed)
Patient cleared by behavioral health for discharge home.   Hayley Buckley, Hayley Spraggins, MD 02/14/17 [phone]

## 2017-02-14 NOTE — ED Notes (Signed)
TTS In progress.  

## 2017-02-14 NOTE — ED Notes (Signed)
Pt states TTS told her she could be d/c. And [facility] Pt stats boyfriend is picking her up

## 2017-05-19 ENCOUNTER — Encounter (HOSPITAL_COMMUNITY): Payer: Self-pay | Admitting: *Deleted

## 2017-05-19 ENCOUNTER — Emergency Department (HOSPITAL_COMMUNITY)
Admission: EM | Admit: 2017-05-19 | Discharge: 2017-05-20 | Disposition: A | Discharge status: Home / Self Care | Payer: Self-pay | Attending: Emergency Medicine | Admitting: Emergency Medicine

## 2017-05-19 ENCOUNTER — Emergency Department (HOSPITAL_COMMUNITY): Payer: Self-pay

## 2017-05-19 ENCOUNTER — Other Ambulatory Visit: Payer: Self-pay

## 2017-05-19 DIAGNOSIS — F17210 Nicotine dependence, cigarettes, uncomplicated: Secondary | ICD-10-CM | POA: Insufficient documentation

## 2017-05-19 DIAGNOSIS — J40 Bronchitis, not specified as acute or chronic: Secondary | ICD-10-CM | POA: Insufficient documentation

## 2017-05-19 DIAGNOSIS — I10 Essential (primary) hypertension: Secondary | ICD-10-CM | POA: Insufficient documentation

## 2017-05-19 DIAGNOSIS — Z79899 Other long term (current) drug therapy: Secondary | ICD-10-CM | POA: Insufficient documentation

## 2017-05-19 MED ORDER — PREDNISONE 20 MG PO TABS
40.0000 mg | ORAL_TABLET | Freq: Once | ORAL | Status: AC
  Administered 2017-05-19: 40 mg via ORAL
  Filled 2017-05-19: qty 2

## 2017-05-19 MED ORDER — ALBUTEROL SULFATE (2.5 MG/3ML) 0.083% IN NEBU
2.5000 mg | INHALATION_SOLUTION | Freq: Once | RESPIRATORY_TRACT | Status: AC
  Administered 2017-05-19: 2.5 mg via RESPIRATORY_TRACT
  Filled 2017-05-19: qty 3

## 2017-05-19 MED ORDER — ALBUTEROL SULFATE HFA 108 (90 BASE) MCG/ACT IN AERS
2.0000 | INHALATION_SPRAY | Freq: Once | RESPIRATORY_TRACT | Status: AC
  Administered 2017-05-19: 2 via RESPIRATORY_TRACT
  Filled 2017-05-19: qty 6.7

## 2017-05-19 MED ORDER — IPRATROPIUM-ALBUTEROL 0.5-2.5 (3) MG/3ML IN SOLN
3.0000 mL | Freq: Once | RESPIRATORY_TRACT | Status: AC
  Administered 2017-05-19: 3 mL via RESPIRATORY_TRACT
  Filled 2017-05-19: qty 3

## 2017-05-19 NOTE — ED Triage Notes (Signed)
Pt c/o cough that is productive with clear sputum, wheezing that started two weeks ago and has gotten worse

## 2017-05-19 NOTE — ED Provider Notes (Signed)
Medical screening examination/treatment/procedure(s) were performed by non-physician practitioner and as supervising physician I was immediately available for consultation/collaboration.  ED ECG REPORT   Date: 05/19/2017  Rate: 101  Rhythm: sinus tachycardia  QRS Axis: normal  Intervals: normal  ST/T Wave abnormalities: normal  Conduction Disutrbances:none  Narrative Interpretation: Sinus tachycardia  Old EKG Reviewed: changes noted  I have personally reviewed the EKG tracing and agree with the computerized printout as noted.    Pricilla LovelessGoldston, Nicolemarie Wooley, MD 05/19/17 [phone]

## 2017-05-20 MED ORDER — BENZONATATE 200 MG PO CAPS: 200.0000 mg | ORAL_CAPSULE | Freq: Three times a day (TID) | ORAL | 0 refills | Status: DC | PRN

## 2017-05-20 MED ORDER — PREDNISONE 20 MG PO TABS: 40.0000 mg | ORAL_TABLET | Freq: Every day | ORAL | 0 refills | Status: AC

## 2017-05-20 NOTE — ED Provider Notes (Signed)
[facility] EMERGENCY DEPARTMENT Provider Note   CSN: [id number] Arrival date & time: 05/19/17  2043     History   Chief Complaint Chief Complaint  Patient presents with  . Shortness of Breath    HPI Hayley Buckley is a 52 y.o. female.  HPI   Hayley Buckley is a 52 y.o. female who presents to the Emergency Department complaining of productive cough, wheezing and congestion.  Symptoms present for two weeks.  States that she is currently homeless and has been staying at various locations.  Cough is productive of clear sputum, associated with intermittent wheezing.  Describes cough as excessive at times which also causes shortness of breath with cough.  She has not taken any medications for symptom relief.  She denies chest pain, fever, chills.  No hx of asthma or COPD.     Past Medical History:  Diagnosis Date  . Bipolar 1 disorder (HCC)   . Depression   . Esophageal abnormality   . Hypertension   . Insomnia   . Migraine   . Multiple personality disorder (HCC)   . Ovarian cyst   . Panic attack   . Thyroid disease     Patient Active Problem List   Diagnosis Date Noted  . Nasal congestion 04/22/2016  . Bipolar disorder, curr episode mixed, severe, with psychotic features (HCC) 04/21/2016  . Cannabis use disorder, moderate, dependence (HCC) 04/21/2016  . Bipolar 1 disorder, depressed (HCC) 04/20/2016    Past Surgical History:  Procedure Laterality Date  . CHOLECYSTECTOMY    . gastrectomy tube      OB History    Gravida      Para      Term      Preterm      AB      Living  0     SAB      TAB      Ectopic      Multiple      Live Births               Home Medications    Prior to Admission medications   Medication Sig Start Date End Date Taking? Authorizing Provider  ARIPiprazole (ABILIFY) 5 MG tablet Take 1 tablet (5 mg total) by mouth at bedtime. 04/24/16   Rankin, Shuvon B, NP  benzonatate (TESSALON) 200 MG capsule Take 1 capsule (200 mg total) by  mouth 3 (three) times daily as needed for cough. Swallow whole, do not chew 05/20/17   Andria Head, PA-C  benztropine (COGENTIN) 0.5 MG tablet Take 1 tablet (0.5 mg total) by mouth at bedtime. 04/24/16   Rankin, Shuvon B, NP  carbamazepine (TEGRETOL XR) 100 MG 12 hr tablet Take 1 tablet (100 mg total) by mouth 2 (two) times daily. 04/24/16   Rankin, Shuvon B, NP  diphenhydrAMINE (BENADRYL) 25 mg capsule Take 50 mg by mouth every 6 (six) hours as needed.    [provider]  fluticasone (FLONASE) 50 MCG/ACT nasal spray Place 2 sprays into both nostrils daily. 04/25/16   Rankin, Shuvon B, NP  hydrOXYzine (ATARAX/VISTARIL) 25 MG tablet Take 1 tablet (25 mg total) by mouth every 6 (six) hours. 02/01/17   Burgess Amor, PA-C  loratadine (CLARITIN) 10 MG tablet Take 1 tablet (10 mg total) by mouth daily. Over the counter 04/25/16   Rankin, Shuvon B, NP  predniSONE (DELTASONE) 20 MG tablet Take 2 tablets (40 mg total) by mouth daily. For 5 days 05/20/17   Pauline Aus, PA-C  traZODone (DESYREL) 50 MG tablet Take 1 tablet (50 mg total) by mouth at bedtime. 02/01/17   Burgess Amor, PA-C    Family History Family History  Problem Relation Age of Onset  . Alzheimer's disease Mother   . Diabetes Mother   . Lung disease Father   . Bipolar disorder Father   . Heart attack Brother   . Diabetes Other   . Heart attack Other   . Cancer Other   . ALS Other     Social History Social History   Tobacco Use  . Smoking status: Current Every Day Smoker    Packs/day: 0.50    Types: Cigarettes  . Smokeless tobacco: Never Used  Substance Use Topics  . Alcohol use: Yes    Comment: social  . Drug use: No     Allergies   Patient has no known allergies.   Review of Systems Review of Systems  Constitutional: Negative for appetite change, chills and fever.  HENT: Positive for congestion. Negative for sore throat and trouble swallowing.   Respiratory: Positive for cough, chest tightness, shortness of  breath and wheezing.   Cardiovascular: Negative for chest pain.  Gastrointestinal: Negative for abdominal pain, nausea and vomiting.  Genitourinary: Negative for dysuria.  Musculoskeletal: Negative for arthralgias.  Skin: Negative for rash.  Neurological: Negative for dizziness, weakness and numbness.  Hematological: Negative for adenopathy.  All other systems reviewed and are negative.    Physical Exam Updated Vital Signs BP 136/70   Pulse 97   Temp 99.1 F (37.3 C) (Oral)   Resp (!) 21   Ht 5\' 3"  (1.6 m)   Wt 77.1 kg (170 lb)   SpO2 92%   BMI 30.11 kg/m   Physical Exam  Constitutional: She is oriented to person, place, and time. She appears well-developed and well-nourished. No distress.  HENT:  Head: Normocephalic and atraumatic.  Right Ear: Tympanic membrane and ear canal normal.  Left Ear: Tympanic membrane and ear canal normal.  Mouth/Throat: Uvula is midline, oropharynx is clear and moist and mucous membranes are normal. No oropharyngeal exudate.  Eyes: Pupils are equal, round, and reactive to light. EOM are normal.  Neck: Normal range of motion, full passive range of motion without pain and phonation normal. Neck supple.  Cardiovascular: Normal rate, regular rhythm and intact distal pulses.  Pulmonary/Chest: Effort normal. No stridor. No respiratory distress. She has wheezes. She has no rales. She exhibits no tenderness.  Coarse lungs sounds bilaterally with scattered expiratory wheezing.  Pt able to speak in full sentences w/o respiratory distress.   Abdominal: Soft. There is no tenderness.  Musculoskeletal: She exhibits no edema.  Lymphadenopathy:    She has no cervical adenopathy.  Neurological: She is alert and oriented to person, place, and time. She exhibits normal muscle tone. Coordination normal.  Skin: Skin is warm and dry. No rash noted.  Psychiatric: She has a normal mood and affect.  Nursing note and vitals reviewed.    ED Treatments / Results    Labs (all labs ordered are listed, but only abnormal results are displayed) Labs Reviewed - No data to display  EKG  EKG Interpretation None       Radiology Dg Chest 2 View  Result Date: 05/19/2017 CLINICAL DATA:  Cough that is productive with sputum and shortness of breath. EXAM: CHEST - 2 VIEW COMPARISON:  CT chest 02/27/2016.  Chest radiograph 02/27/2016. FINDINGS: The heart size and mediastinal contours are within normal limits. Both lungs are clear.  No acute skeletal findings. The RIGHT fourth and fifth ribs appear fused, possible old trauma, unchanged. IMPRESSION: No active cardiopulmonary disease.  Stable appearance from priors. Electronically Signed   By: Elsie StainJohn T Curnes M.D.   On: 05/19/2017 21:25    Procedures Procedures (including critical care time)  Medications Ordered in ED Medications  predniSONE (DELTASONE) tablet 40 mg (has no administration in time range)  albuterol (PROVENTIL) (2.5 MG/3ML) 0.083% nebulizer solution 2.5 mg (2.5 mg Nebulization Given 05/19/17 2224)  ipratropium-albuterol (DUONEB) 0.5-2.5 (3) MG/3ML nebulizer solution 3 mL (3 mLs Nebulization Given 05/19/17 2224)  albuterol (PROVENTIL HFA;VENTOLIN HFA) 108 (90 Base) MCG/ACT inhaler 2 puff (2 puffs Inhalation Given 05/19/17 2227)     Initial Impression / Assessment and Plan / ED Course  I have reviewed the triage vital signs and the nursing notes.  Pertinent labs & imaging results that were available during my care of the patient were reviewed by me and considered in my medical decision making (see chart for details).     Pt with likely bronchitis, feeling better after albuterol neb, family member requesting d/c, MDI dispensed. PERC neg.  Vitals reassuring, CXR neg for PNA.  Resource list provided.  Pt agrees to tx plan.  Stable for d/c.  Return precautions discussed.    Final Clinical Impressions(s) / ED Diagnoses   Final diagnoses:  Bronchitis    ED Discharge Orders        Ordered     predniSONE (DELTASONE) 20 MG tablet  Daily     05/20/17 0012    benzonatate (TESSALON) 200 MG capsule  3 times daily PRN     05/20/17 0012       Pauline Ausriplett, Amaree Loisel, PA-C 05/21/17 1338    Pricilla LovelessGoldston, Scott, MD 05/23/17 [phone]

## 2017-05-20 NOTE — ED Notes (Signed)
Pt walked off unit before signing and obtaining discharge paperwork

## 2017-05-20 NOTE — Discharge Instructions (Addendum)
2 puffs of the albuterol inhaler 4 times a day as needed.  Drink plenty of fluids.  Start the prednisone prescription tomorrow.  You can contact the clinic listed on the information provided to arrange primary care.  Return to the ER for any worsening symptoms.

## 2017-08-19 IMAGING — CT CT ANGIO CHEST-ABD-PELV FOR DISSECTION W/ AND WO/W CM
2 of 7 series · 13 of 46 positions shown, 15 images · IV contrast (Isovue)
Comparison: Chest radiograph 02/27/2016

CLINICAL DATA: LEFT anterior chest pain radiates to the back. This
began earlier today.

EXAM:
CT ANGIOGRAPHY CHEST, ABDOMEN AND PELVIS
TECHNIQUE: Multidetector CT imaging through the chest, abdomen and pelvis was
performed using the standard protocol during bolus administration of
intravenous contrast. Multiplanar reconstructed images and MIPs were
obtained and reviewed to evaluate the vascular anatomy.
CONTRAST:  Isovue 370, 100 mL.

[Series 5: dissection axial arterial · axial · arterial · 0.69mm/px · z∈[-524,+49]mm · 10 of 215 slices shown, 12 images]
[im 12/215  soft-tissue]
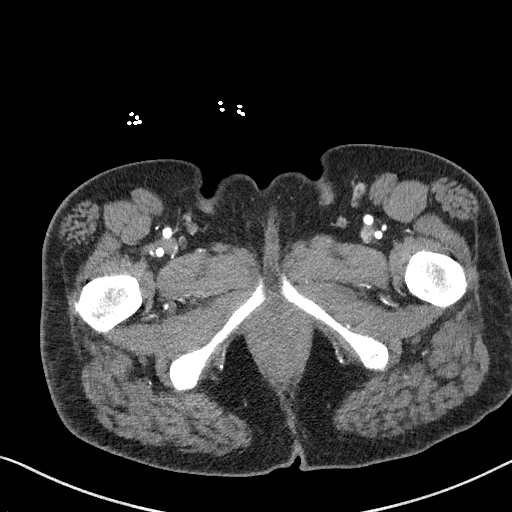
[im 12/215  bone]
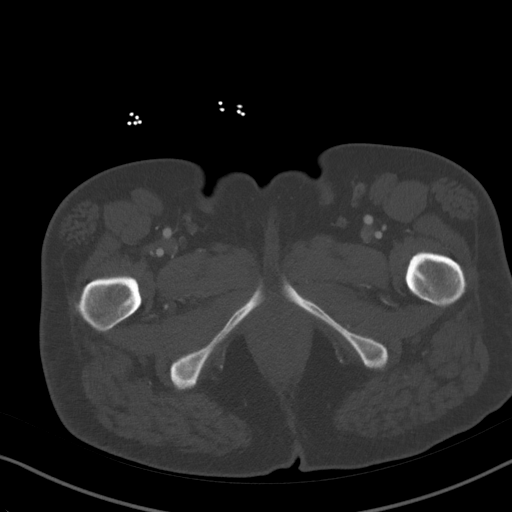
[im 34/215  soft-tissue]
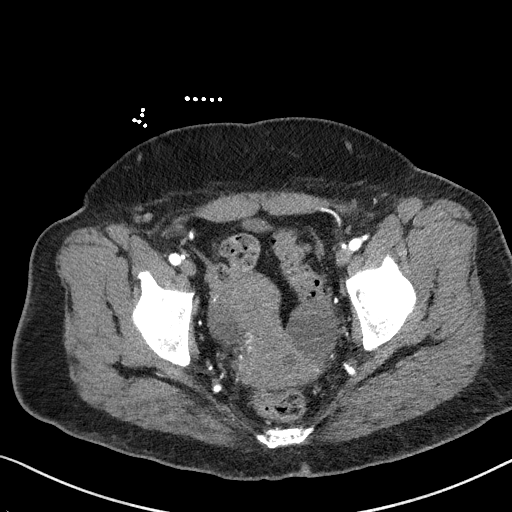
[im 57/215  soft-tissue]
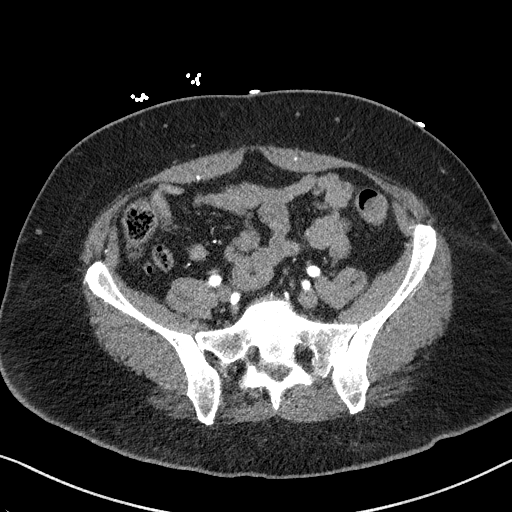
[im 79/215  soft-tissue]
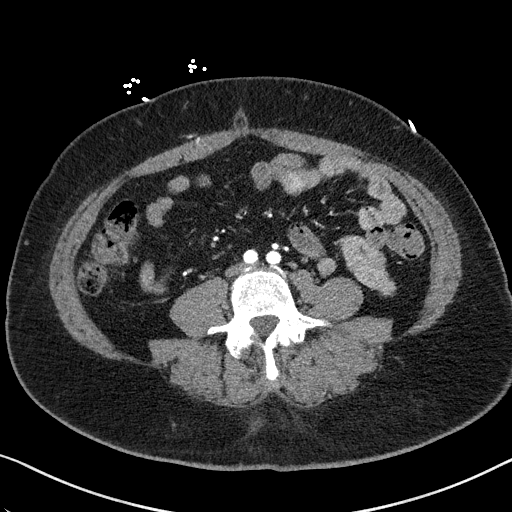
[im 102/215  soft-tissue]
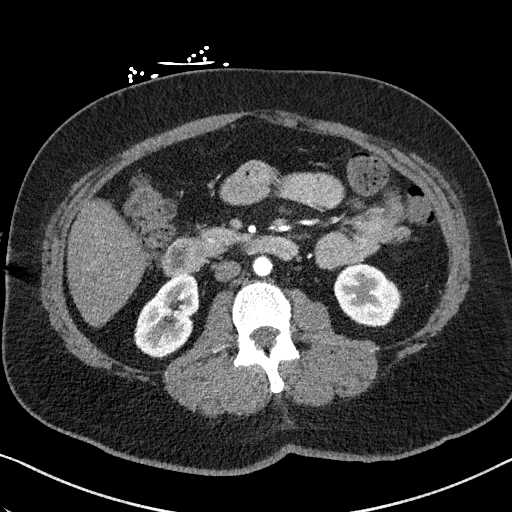
[im 113/215  soft-tissue]
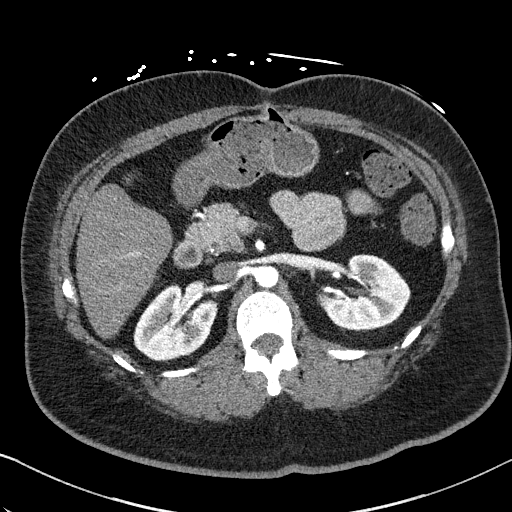
[im 136/215  soft-tissue]
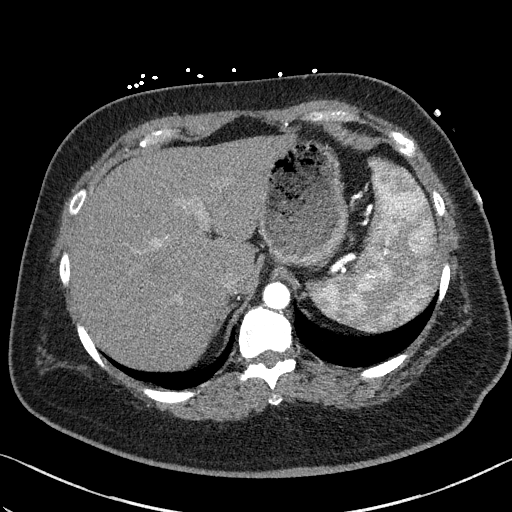
[im 158/215  soft-tissue]
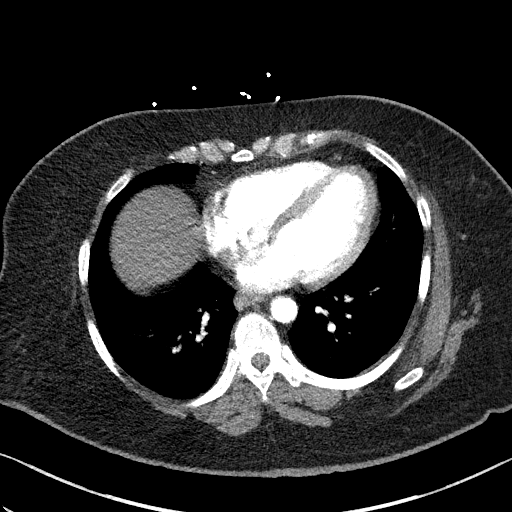
[im 181/215  soft-tissue]
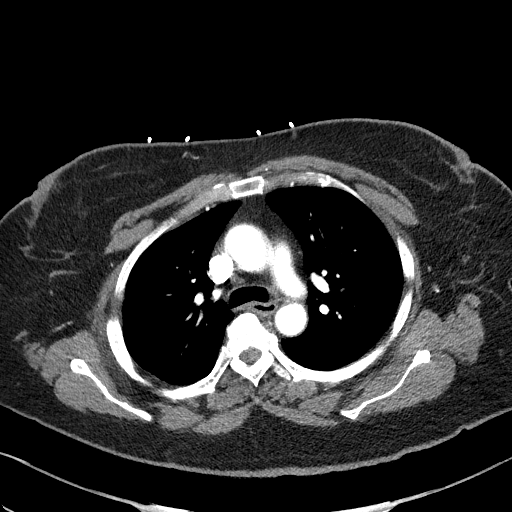
[im 181/215  bone]
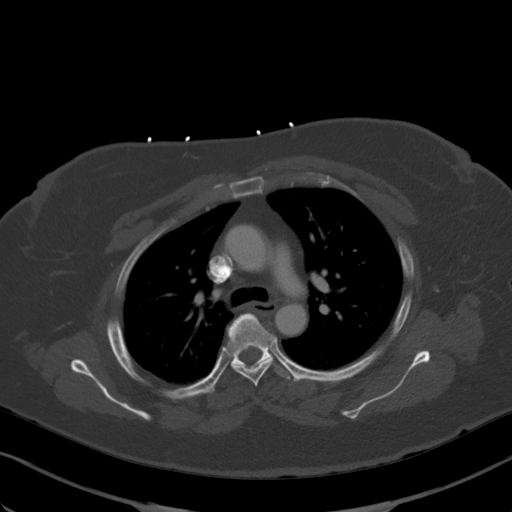
[im 203/215  soft-tissue]
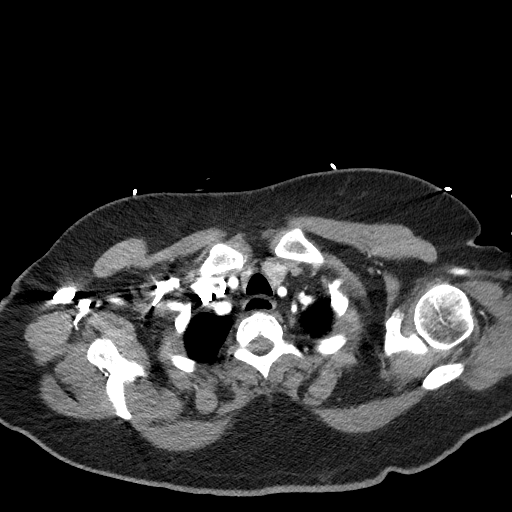

[Series 6: coronals · coronal · 0.66mm/px · 3 of 127 slices shown]
[im 32/127  soft-tissue]
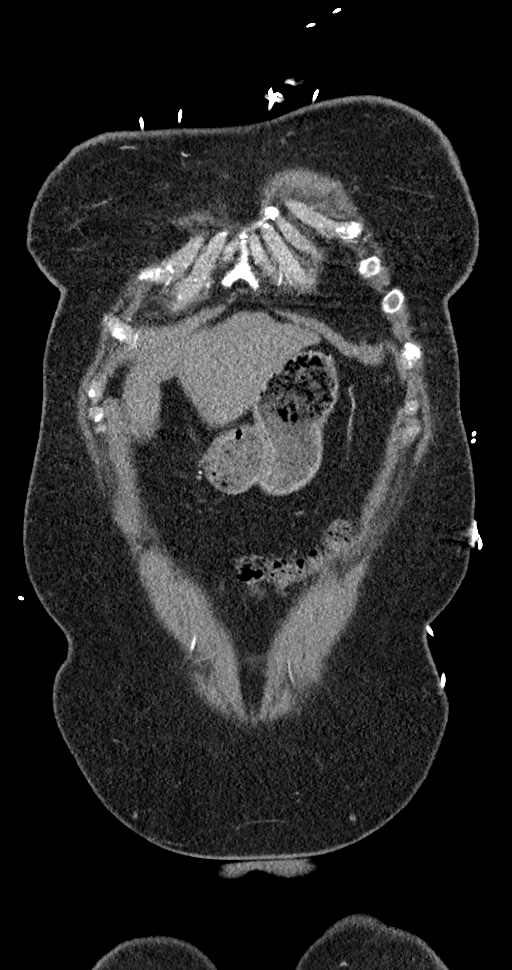
[im 64/127  soft-tissue]
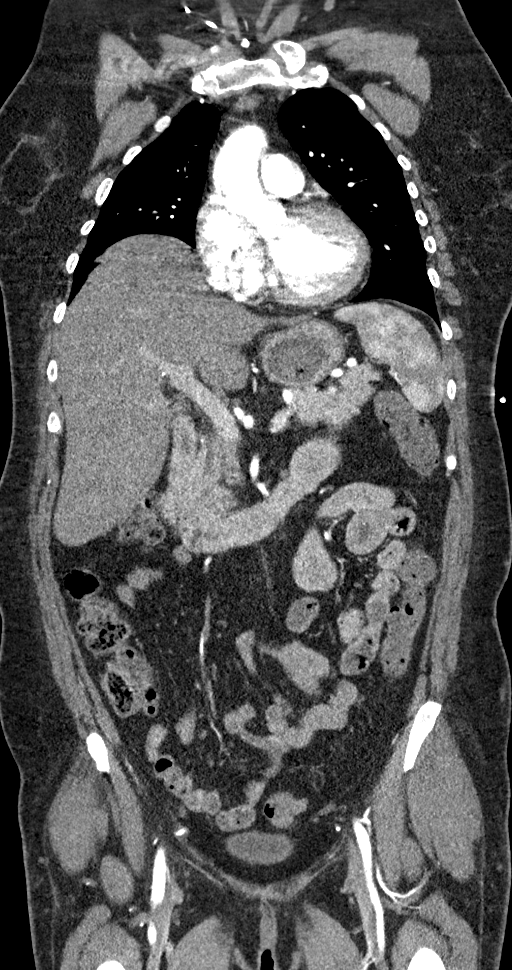
[im 95/127  soft-tissue]
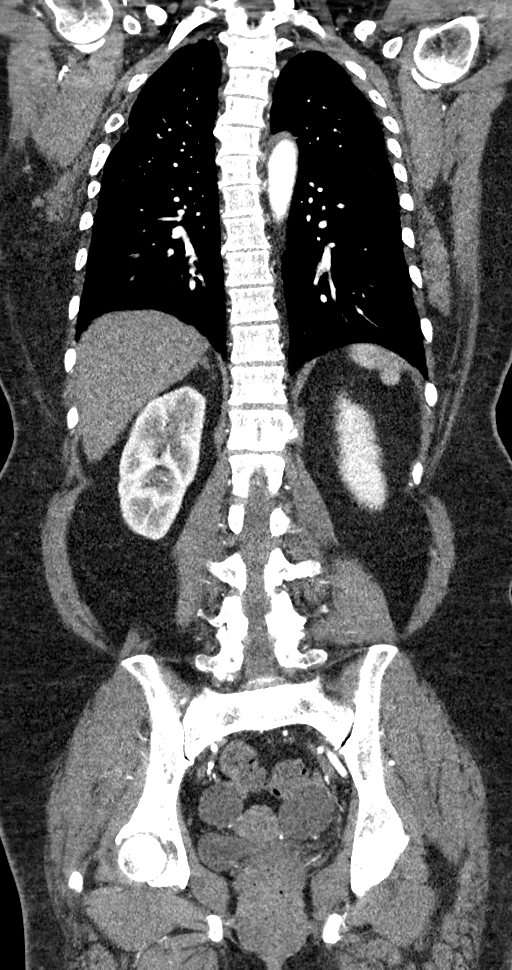

[13 of 46 positions shown; findings below may reference images not displayed]

FINDINGS: CTA CHEST FINDINGS

Cardiovascular: Preferential opacification of the thoracic aorta. No
evidence of thoracic aortic aneurysm or dissection. Normal heart
size. No pericardial effusion.

Mediastinum/Nodes: No enlarged mediastinal, hilar, or axillary lymph
nodes. Thyroid gland, trachea, and esophagus demonstrate no
significant findings.

Lungs/Pleura: Lungs are clear. No pleural effusion or pneumothorax.

Musculoskeletal: No chest wall abnormality. No acute or significant
osseous findings.

Review of the MIP images confirms the above findings.

CTA ABDOMEN AND PELVIS FINDINGS

VASCULAR

Aorta: Normal caliber aorta without aneurysm, dissection, vasculitis
or significant stenosis.

Celiac: Patent without evidence of aneurysm, dissection, vasculitis
or significant stenosis.

SMA: Patent without evidence of aneurysm, dissection, vasculitis or
significant stenosis.

Renals: Both renal arteries are patent without evidence of aneurysm,
dissection, vasculitis, fibromuscular dysplasia or significant
stenosis.

IMA: Patent without evidence of aneurysm, dissection, vasculitis or
significant stenosis.

Inflow: Normal caliber aorta without aneurysm, dissection,
vasculitis or significant stenosis.

Veins: No obvious venous abnormality within the limitations of this
arterial phase study.

Review of the MIP images confirms the above findings.

NON-VASCULAR

Hepatobiliary: Steatosis.  Cholecystectomy.  No masses.

Pancreas: Unremarkable. No pancreatic ductal dilatation or
surrounding inflammatory changes.

Spleen: Mild splenomegaly is suggested.  Small splenule is noted.

Adrenals/Urinary Tract: Adrenal glands are unremarkable. Kidneys are
normal, without renal calculi, focal lesion, or hydronephrosis.
Bladder is unremarkable.

Stomach/Bowel: Stomach is within normal limits. Appendix appears
normal. No evidence of bowel wall thickening, distention, or
inflammatory changes.

Lymphatic: No significant vascular findings are present. No enlarged
abdominal or pelvic lymph nodes.

Reproductive: Uterus and bilateral adnexa are unremarkable.

Other: No abdominal wall hernia or ascites.

Musculoskeletal: Spondylosis.  No worrisome osseous lesion.

Review of the MIP images confirms the above findings.
IMPRESSION: No evidence for aortic or arterial dissection. No acute findings in
the chest or abdomen.

## 2019-06-09 ENCOUNTER — Other Ambulatory Visit: Payer: Self-pay

## 2019-06-09 ENCOUNTER — Emergency Department (HOSPITAL_COMMUNITY)
Admission: EM | Admit: 2019-06-09 | Discharge: 2019-06-09 | Disposition: A | Discharge status: Home / Self Care | Payer: Medicaid Other | Attending: Emergency Medicine | Admitting: Emergency Medicine

## 2019-06-09 ENCOUNTER — Encounter (HOSPITAL_COMMUNITY): Payer: Self-pay | Admitting: Emergency Medicine

## 2019-06-09 DIAGNOSIS — F17210 Nicotine dependence, cigarettes, uncomplicated: Secondary | ICD-10-CM | POA: Insufficient documentation

## 2019-06-09 DIAGNOSIS — I10 Essential (primary) hypertension: Secondary | ICD-10-CM | POA: Insufficient documentation

## 2019-06-09 DIAGNOSIS — Z79899 Other long term (current) drug therapy: Secondary | ICD-10-CM | POA: Insufficient documentation

## 2019-06-09 DIAGNOSIS — H6123 Impacted cerumen, bilateral: Secondary | ICD-10-CM | POA: Insufficient documentation

## 2019-06-09 MED ORDER — CARBAMIDE PEROXIDE 6.5 % OT SOLN: 5.0000 [drp] | Freq: Two times a day (BID) | OTIC | 0 refills | Status: AC

## 2019-06-09 MED ORDER — DOCUSATE SODIUM 50 MG/5ML PO LIQD
50.0000 mg | Freq: Every day | ORAL | Status: DC
  Administered 2019-06-09: 14:00:00 50 mg via OTIC
  Filled 2019-06-09 (×3): qty 10

## 2019-06-09 NOTE — ED Triage Notes (Signed)
Patient is here for bilateral ear irrigation.

## 2019-06-09 NOTE — ED Provider Notes (Signed)
[facility] Provider Note   CSN: [id number] Arrival date & time: 06/09/19  1102     History Chief Complaint  Patient presents with  . Cerumen Impaction    Hayley Buckley is a 54 y.o. female with past medical history significant for bipolar disorder, hypertension, migraines, multiple personality disorder, thyroid disease presents to emergency department today with chief complaint of bilateral ear pain x1 week.  Patient states she needs her ears cleaned out.  She has tried to clean them out at home with Q-tips however has been unsuccessful.  She wears earplugs at work and states this occasionally happens.  She has mild dull aching pain localized to her ears.  She rates the pain 10 of 10 in severity.  She denies any infectious symptoms.  Denies any fever, chills, cough, congestion, tinnitus, loss of hearing, shortness of breath, chest pain, urinary symptoms, diarrhea.  History provided by patient with additional history obtained from chart review.       Past Medical History:  Diagnosis Date  . Bipolar 1 disorder ([place])   . Depression   . Esophageal abnormality   . Hypertension   . Insomnia   . Migraine   . Multiple personality disorder ([place])   . Ovarian cyst   . Panic attack   . Thyroid disease     Patient Active Problem List   Diagnosis Date Noted  . Nasal congestion 04/22/2016  . Bipolar disorder, curr episode mixed, severe, with psychotic features ([place]) 04/21/2016  . Cannabis use disorder, moderate, dependence ([place]) 04/21/2016  . Bipolar 1 disorder, depressed ([place]) 04/20/2016    Past Surgical History:  Procedure Laterality Date  . CHOLECYSTECTOMY    . gastrectomy tube       OB History    Gravida      Para      Term      Preterm      AB      Living  0     SAB      TAB      Ectopic      Multiple      Live Births              Family History  Problem Relation Age of Onset  . Alzheimer's disease Mother   . Diabetes Mother   .  Lung disease Father   . Bipolar disorder Father   . Heart attack Brother   . Diabetes Other   . Heart attack Other   . Cancer Other   . ALS Other     Social History   Tobacco Use  . Smoking status: Current Every Day Smoker    Packs/day: 0.50    Types: Cigarettes  . Smokeless tobacco: Never Used  Substance Use Topics  . Alcohol use: Yes    Comment: social  . Drug use: No    Home Medications Prior to Admission medications   Medication Sig Start Date End Date Taking? Authorizing Provider  ARIPiprazole (ABILIFY) 5 MG tablet Take 1 tablet (5 mg total) by mouth at bedtime. 04/24/16   Rankin, Shuvon B, NP  benzonatate (TESSALON) 200 MG capsule Take 1 capsule (200 mg total) by mouth 3 (three) times daily as needed for cough. Swallow whole, do not chew 05/20/17   Triplett, Tammy, PA-C  benztropine (COGENTIN) 0.5 MG tablet Take 1 tablet (0.5 mg total) by mouth at bedtime. 04/24/16   Rankin, Shuvon B, NP  carbamazepine (TEGRETOL XR) 100 MG 12 hr tablet Take  1 tablet (100 mg total) by mouth 2 (two) times daily. 04/24/16   Rankin, Shuvon B, NP  carbamide peroxide (DEBROX) 6.5 % OTIC solution Place 5 drops into both ears 2 (two) times daily for 4 days. 06/10/19 06/14/19  Stiven Kaspar E, PA-C  diphenhydrAMINE (BENADRYL) 25 mg capsule Take 50 mg by mouth every 6 (six) hours as needed.    [provider]  fluticasone (FLONASE) 50 MCG/ACT nasal spray Place 2 sprays into both nostrils daily. 04/25/16   Rankin, Shuvon B, NP  hydrOXYzine (ATARAX/VISTARIL) 25 MG tablet Take 1 tablet (25 mg total) by mouth every 6 (six) hours. 02/01/17   Burgess Amor, PA-C  loratadine (CLARITIN) 10 MG tablet Take 1 tablet (10 mg total) by mouth daily. Over the counter 04/25/16   Rankin, Shuvon B, NP  predniSONE (DELTASONE) 20 MG tablet Take 2 tablets (40 mg total) by mouth daily. For 5 days 05/20/17   Pauline Aus, PA-C  traZODone (DESYREL) 50 MG tablet Take 1 tablet (50 mg total) by mouth at bedtime. 02/01/17    Burgess Amor, PA-C    Allergies    Patient has no known allergies.  Review of Systems   Review of Systems  All other systems are reviewed and are negative for acute change except as noted in the HPI.   Physical Exam Updated Vital Signs BP 135/73 (BP Location: Right Arm)   Pulse 83   Temp 98.4 F (36.9 C) (Oral)   Resp 18   Ht 5\' 3"  (1.6 m)   Wt 72.6 kg   SpO2 97%   BMI 28.34 kg/m   Physical Exam Vitals and nursing note reviewed.  Constitutional:      Appearance: She is well-developed. She is not ill-appearing or toxic-appearing.  HENT:     Head: Normocephalic and atraumatic.     Right Ear: Ear canal and external ear normal. No mastoid tenderness.     Left Ear: Ear canal and external ear normal. No mastoid tenderness.     Ears:     Comments: Unable to visualize TMs 2/2 to impacted cerumen bilaterally.    Nose: Nose normal.  Eyes:     General: No scleral icterus.       Right eye: No discharge.        Left eye: No discharge.     Conjunctiva/sclera: Conjunctivae normal.  Neck:     Vascular: No JVD.  Cardiovascular:     Rate and Rhythm: Normal rate and regular rhythm.     Pulses: Normal pulses.     Heart sounds: Normal heart sounds.  Pulmonary:     Effort: Pulmonary effort is normal.     Breath sounds: Normal breath sounds.  Abdominal:     General: There is no distension.  Musculoskeletal:        General: Normal range of motion.     Cervical back: Normal range of motion.  Skin:    General: Skin is warm and dry.  Neurological:     Mental Status: She is oriented to person, place, and time.     GCS: GCS eye subscore is 4. GCS verbal subscore is 5. GCS motor subscore is 6.     Comments: Fluent speech, no facial droop.  Psychiatric:        Behavior: Behavior normal.      ED Results / Procedures / Treatments   Labs (all labs ordered are listed, but only abnormal results are displayed) Labs Reviewed - No data to display  EKG None  Radiology No results  found.  Procedures .Ear Cerumen Removal  Date/Time: 06/09/2019 3:15 PM Performed by: Sherene Sires, PA-C Authorized by: Sherene Sires, PA-C   Consent:    Consent obtained:  Verbal   Consent given by:  Patient   Risks discussed:  Bleeding, infection, TM perforation and dizziness   Alternatives discussed:  No treatment Procedure details:    Location:  L ear and R ear   Procedure type: curette   Post-procedure details:    Inspection:  TM intact   Hearing quality:  Improved   Patient tolerance of procedure:  Tolerated well, no immediate complications   (including critical care time)  Medications Ordered in ED Medications  docusate (COLACE) 50 MG/5ML liquid 50 mg (50 mg Both EARS Given 06/09/19 1429)    ED Course  I have reviewed the triage vital signs and the nursing notes.  Pertinent labs & imaging results that were available during my care of the patient were reviewed by me and considered in my medical decision making (see chart for details).    MDM Rules/Calculators/A&P                      Patient seen and examined. Patient presents awake, alert, hemodynamically stable, afebrile, non toxic.  Patient presents with bilateral ear pain.  On exam she has impacted cerumen bilaterally.  Was unable to visualize TM on initial evaluation.  After using Colace and curette was able to remove cerumen and visualize TMs that appear to be intact.  Please see procedure note above, patient tolerated well.  She was still having some feeling of fullness on the left, will discharge with prescription for Debrox drops.  The patient appears reasonably screened and/or stabilized for discharge and I doubt any other medical condition or other [facility] requiring further screening, evaluation, or treatment in the ED at this time prior to discharge. The patient is safe for discharge with strict return precautions discussed. Recommend pcp follow up.  She does not have PCP, resource list  given.   Portions of this note were generated with Scientist, clinical (histocompatibility and immunogenetics). Dictation errors may occur despite best attempts at proofreading.   Final Clinical Impression(s) / ED Diagnoses Final diagnoses:  Bilateral impacted cerumen    Rx / DC Orders ED Discharge Orders         Ordered    carbamide peroxide (DEBROX) 6.5 % OTIC solution  2 times daily     06/09/19 1514           Elzada Pytel, Caroleen Hamman, PA-C 06/09/19 1524    Vanetta Mulders, MD 06/10/19 [phone]

## 2019-06-09 NOTE — Discharge Instructions (Addendum)
You have been seen today for ear wax impaction. Please read and follow all provided instructions. Return to the emergency room for worsening condition or new concerning symptoms.    1. Medications:  Prescription sent to the pharmacy for Debrox drops.  These are used to help earwax buildup.  Please use as prescribed, place 5 drops into both ears 2 times daily for 4 days. Continue usual home medications Take medications as prescribed. Please review all of the medicines and only take them if you do not have an allergy to them.   2. Treatment: rest, drink plenty of fluids  3. Follow Up:  Please follow up with primary care provider by scheduling an appointment as soon as possible for a visit  If you do not have a primary care physician, contact [facility] at [phone] for referral   It is also a possibility that you have an allergic reaction to any of the medicines that you have been prescribed - Everybody reacts differently to medications and while MOST people have no trouble with most medicines, you may have a reaction such as nausea, vomiting, rash, swelling, shortness of breath. If this is the case, please stop taking the medicine immediately and contact your physician.  ?

## 2019-06-20 ENCOUNTER — Ambulatory Visit: Payer: Self-pay

## 2019-06-22 ENCOUNTER — Ambulatory Visit: Payer: Self-pay

## 2019-10-05 ENCOUNTER — Encounter (HOSPITAL_COMMUNITY): Payer: Self-pay

## 2019-10-05 ENCOUNTER — Emergency Department (HOSPITAL_COMMUNITY)
Admission: EM | Admit: 2019-10-05 | Discharge: 2019-10-05 | Disposition: A | Discharge status: Home / Self Care | Payer: Self-pay | Attending: Emergency Medicine | Admitting: Emergency Medicine

## 2019-10-05 ENCOUNTER — Emergency Department (HOSPITAL_COMMUNITY): Payer: Self-pay

## 2019-10-05 ENCOUNTER — Other Ambulatory Visit: Payer: Self-pay

## 2019-10-05 DIAGNOSIS — F17210 Nicotine dependence, cigarettes, uncomplicated: Secondary | ICD-10-CM | POA: Insufficient documentation

## 2019-10-05 DIAGNOSIS — R1084 Generalized abdominal pain: Secondary | ICD-10-CM | POA: Insufficient documentation

## 2019-10-05 DIAGNOSIS — R110 Nausea: Secondary | ICD-10-CM | POA: Insufficient documentation

## 2019-10-05 DIAGNOSIS — K5900 Constipation, unspecified: Secondary | ICD-10-CM | POA: Insufficient documentation

## 2019-10-05 DIAGNOSIS — Z79899 Other long term (current) drug therapy: Secondary | ICD-10-CM | POA: Insufficient documentation

## 2019-10-05 DIAGNOSIS — E0789 Other specified disorders of thyroid: Secondary | ICD-10-CM | POA: Insufficient documentation

## 2019-10-05 DIAGNOSIS — I10 Essential (primary) hypertension: Secondary | ICD-10-CM | POA: Insufficient documentation

## 2019-10-05 LAB — CBC WITH DIFFERENTIAL/PLATELET
Abs Immature Granulocytes: 0.01 10*3/uL (ref 0.00–0.07)
Basophils Absolute: 0.1 10*3/uL (ref 0.0–0.1)
Basophils Relative: 1 %
Eosinophils Absolute: 0.2 10*3/uL (ref 0.0–0.5)
Eosinophils Relative: 3 %
HCT: 38.6 % (ref 36.0–46.0)
Hemoglobin: 13.4 g/dL (ref 12.0–15.0)
Immature Granulocytes: 0 %
Lymphocytes Relative: 25 %
Lymphs Abs: 1.7 10*3/uL (ref 0.7–4.0)
MCH: 32.2 pg (ref 26.0–34.0)
MCHC: 34.7 g/dL (ref 30.0–36.0)
MCV: 92.8 fL (ref 80.0–100.0)
Monocytes Absolute: 0.5 10*3/uL (ref 0.1–1.0)
Monocytes Relative: 7 %
Neutro Abs: 4.3 10*3/uL (ref 1.7–7.7)
Neutrophils Relative %: 64 %
Platelets: 220 10*3/uL (ref 150–400)
RBC: 4.16 MIL/uL (ref 3.87–5.11)
RDW: 13.1 % (ref 11.5–15.5)
WBC: 6.7 10*3/uL (ref 4.0–10.5)
nRBC: 0 % (ref 0.0–0.2)

## 2019-10-05 LAB — COMPREHENSIVE METABOLIC PANEL
ALT: 18 U/L (ref 0–44)
AST: 16 U/L (ref 15–41)
Albumin: 4.2 g/dL (ref 3.5–5.0)
Alkaline Phosphatase: 56 U/L (ref 38–126)
Anion gap: 11 (ref 5–15)
BUN: 8 mg/dL (ref 6–20)
CO2: 27 mmol/L (ref 22–32)
Calcium: 8.6 mg/dL — ABNORMAL LOW (ref 8.9–10.3)
Chloride: 99 mmol/L (ref 98–111)
Creatinine, Ser: 0.54 mg/dL (ref 0.44–1.00)
GFR calc Af Amer: >60 mL/min (ref >60)
GFR calc non Af Amer: >60 mL/min (ref >60)
Glucose, Bld: 132 mg/dL — ABNORMAL HIGH (ref 70–99)
Potassium: 3.2 mmol/L — ABNORMAL LOW (ref 3.5–5.1)
Sodium: 137 mmol/L (ref 135–145)
Total Bilirubin: 0.9 mg/dL (ref 0.3–1.2)
Total Protein: 7.2 g/dL (ref 6.5–8.1)

## 2019-10-05 LAB — LIPASE, BLOOD: Lipase: 32 U/L (ref 11–51)

## 2019-10-05 LAB — POC OCCULT BLOOD, ED: Fecal Occult Bld: NEGATIVE

## 2019-10-05 MED ORDER — POLYETHYLENE GLYCOL 3350 17 G PO PACK: 17.0000 g | PACK | Freq: Every day | ORAL | 0 refills | Status: AC

## 2019-10-05 NOTE — ED Triage Notes (Signed)
Pt has chronic constipation/diarrhea, states she does not have insurance and therefore comes to the e.d. for care.   Pt states her pain has worsened for the past 3 days. Pt states she was seen in [place] for same yesterday and had an x-ray which was clear.

## 2019-10-05 NOTE — ED Provider Notes (Signed)
[facility] EMERGENCY DEPARTMENT Provider Note   CSN: [id number] Arrival date & time: 10/05/19  0044     History Chief Complaint  Patient presents with  . Abdominal Pain    chronic    Hayley Buckley is a 54 y.o. female.  Patient with history of hypertension, bipolar disorder here with acute on chronic abdominal pain.  States she is having "problems with my bowels again".  States for the past 2 days has had lower abdominal pain worse in the left side and feels like she is constipated.  Her last bowel movement was yesterday has been small amount.  She was seen 2 days ago at [facility] and prescribed MiraLAX which she states she has been taking.  Has progressively worsening lower abdominal pain since then with nausea but no vomiting.  Denies any black or bloody stools.  States she is still passing gas.  No vomiting or fever.  No chest pain or shortness of breath.  No pain with urination or blood in the urine.  No vaginal bleeding or discharge.  No previous abdominal surgeries other than cholecystectomy.  The history is provided by the patient.  Abdominal Pain Associated symptoms: constipation and nausea   Associated symptoms: no cough, no diarrhea, no dysuria, no fever, no hematuria, no shortness of breath and no vomiting        Past Medical History:  Diagnosis Date  . Bipolar 1 disorder (HCC)   . Depression   . Esophageal abnormality   . Hypertension   . Insomnia   . Migraine   . Multiple personality disorder (HCC)   . Ovarian cyst   . Panic attack   . Thyroid disease     Patient Active Problem List   Diagnosis Date Noted  . Nasal congestion 04/22/2016  . Bipolar disorder, curr episode mixed, severe, with psychotic features (HCC) 04/21/2016  . Cannabis use disorder, moderate, dependence (HCC) 04/21/2016  . Bipolar 1 disorder, depressed (HCC) 04/20/2016    Past Surgical History:  Procedure Laterality Date  . CHOLECYSTECTOMY    . gastrectomy tube       OB  History    Gravida      Para      Term      Preterm      AB      Living  0     SAB      TAB      Ectopic      Multiple      Live Births              Family History  Problem Relation Age of Onset  . Alzheimer's disease Mother   . Diabetes Mother   . Lung disease Father   . Bipolar disorder Father   . Heart attack Brother   . Diabetes Other   . Heart attack Other   . Cancer Other   . ALS Other     Social History   Tobacco Use  . Smoking status: Current Every Day Smoker    Packs/day: 0.50    Types: Cigarettes  . Smokeless tobacco: Never Used  Vaping Use  . Vaping Use: Never used  Substance Use Topics  . Alcohol use: Yes    Comment: social  . Drug use: No    Home Medications Prior to Admission medications   Medication Sig Start Date End Date Taking? Authorizing Provider  escitalopram (LEXAPRO) 10 MG tablet Take 10 mg by mouth daily.   Yes [provider]  traZODone (DESYREL) 50 MG tablet Take 1 tablet (50 mg total) by mouth at bedtime. 02/01/17  Yes Idol, Raynelle Fanning, PA-C  ARIPiprazole (ABILIFY) 5 MG tablet Take 1 tablet (5 mg total) by mouth at bedtime. 04/24/16   Rankin, Shuvon B, NP  benzonatate (TESSALON) 200 MG capsule Take 1 capsule (200 mg total) by mouth 3 (three) times daily as needed for cough. Swallow whole, do not chew 05/20/17   Triplett, Tammy, PA-C  benztropine (COGENTIN) 0.5 MG tablet Take 1 tablet (0.5 mg total) by mouth at bedtime. 04/24/16   Rankin, Shuvon B, NP  carbamazepine (TEGRETOL XR) 100 MG 12 hr tablet Take 1 tablet (100 mg total) by mouth 2 (two) times daily. 04/24/16   Rankin, Shuvon B, NP  diphenhydrAMINE (BENADRYL) 25 mg capsule Take 50 mg by mouth every 6 (six) hours as needed.    [provider]  fluticasone (FLONASE) 50 MCG/ACT nasal spray Place 2 sprays into both nostrils daily. 04/25/16   Rankin, Shuvon B, NP  hydrOXYzine (ATARAX/VISTARIL) 25 MG tablet Take 1 tablet (25 mg total) by mouth every 6 (six) hours.  02/01/17   Burgess Amor, PA-C  loratadine (CLARITIN) 10 MG tablet Take 1 tablet (10 mg total) by mouth daily. Over the counter 04/25/16   Rankin, Shuvon B, NP  predniSONE (DELTASONE) 20 MG tablet Take 2 tablets (40 mg total) by mouth daily. For 5 days 05/20/17   Pauline Aus, PA-C    Allergies    Patient has no known allergies.  Review of Systems   Review of Systems  Constitutional: Negative for activity change, appetite change and fever.  HENT: Negative for congestion and rhinorrhea.   Eyes: Negative for visual disturbance.  Respiratory: Negative for cough, chest tightness and shortness of breath.   Gastrointestinal: Positive for abdominal pain, constipation and nausea. Negative for diarrhea and vomiting.  Genitourinary: Negative for dysuria and hematuria.  Musculoskeletal: Negative for arthralgias, back pain and myalgias.  Skin: Negative for wound.  Neurological: Negative for dizziness, weakness and headaches.   all other systems are negative except as noted in the HPI and PMH.   Physical Exam Updated Vital Signs BP (!) 154/98 (BP Location: Right Arm)   Pulse 91   Temp 98.2 F (36.8 C) (Oral)   Resp 16   Ht 5\' 3"  (1.6 m)   Wt 72.6 kg   LMP 01/11/2017 (Approximate)   SpO2 98%   BMI 28.34 kg/m   Physical Exam Vitals and nursing note reviewed.  Constitutional:      General: She is not in acute distress.    Appearance: She is well-developed.  HENT:     Head: Normocephalic and atraumatic.     Mouth/Throat:     Pharynx: No oropharyngeal exudate.  Eyes:     Conjunctiva/sclera: Conjunctivae normal.     Pupils: Pupils are equal, round, and reactive to light.  Neck:     Comments: No meningismus. Cardiovascular:     Rate and Rhythm: Normal rate and regular rhythm.     Heart sounds: Normal heart sounds. No murmur heard.   Pulmonary:     Effort: Pulmonary effort is normal. No respiratory distress.     Breath sounds: Normal breath sounds.  Abdominal:     Palpations:  Abdomen is soft.     Tenderness: There is abdominal tenderness. There is no guarding or rebound.     Comments: Mild diffuse tenderness, soft, no guarding or rebound  Genitourinary:    Comments: Chaperone present, no  fecal impaction, no gross blood Musculoskeletal:        General: No tenderness. Normal range of motion.     Cervical back: Normal range of motion and neck supple.  Skin:    General: Skin is warm.  Neurological:     Mental Status: She is alert and oriented to person, place, and time.     Cranial Nerves: No cranial nerve deficit.     Motor: No abnormal muscle tone.     Coordination: Coordination normal.     Comments:  5/5 strength throughout. CN 2-12 intact.Equal grip strength.   Psychiatric:        Behavior: Behavior normal.     ED Results / Procedures / Treatments   Labs (all labs ordered are listed, but only abnormal results are displayed) Labs Reviewed  COMPREHENSIVE METABOLIC PANEL - Abnormal; Notable for the following components:      Result Value   Potassium 3.2 (*)    Glucose, Bld 132 (*)    Calcium 8.6 (*)    All other components within normal limits  CBC WITH DIFFERENTIAL/PLATELET  LIPASE, BLOOD  URINALYSIS, ROUTINE W REFLEX MICROSCOPIC  PREGNANCY, URINE  POC OCCULT BLOOD, ED    EKG None  Radiology DG Abdomen Acute W/Chest  Result Date: 10/05/2019 CLINICAL DATA:  Constipation, diarrhea, and abdominal pain worse over 3 days. EXAM: DG ABDOMEN ACUTE W/ 1V CHEST COMPARISON:  Chest 05/19/2017.  Abdomen 02/10/2016 FINDINGS: Heart size and pulmonary vascularity are normal. Lungs are clear. No pleural effusions. Mediastinal contours appear intact. Coalition of the right posterior fourth and fifth ribs. Scattered gas and stool in the colon. No small or large bowel distention. No free intra-abdominal air. No radiopaque stones. Surgical clips in the right upper quadrant. Degenerative changes in the spine and hips. IMPRESSION: No evidence of active pulmonary  disease. Nonobstructive bowel gas pattern. Electronically Signed   By: Burman Nieves M.D.   On: 10/05/2019 05:55    Procedures Procedures (including critical care time)  Medications Ordered in ED Medications - No data to display  ED Course  I have reviewed the triage vital signs and the nursing notes.  Pertinent labs & imaging results that were available during my care of the patient were reviewed by me and considered in my medical decision making (see chart for details).    MDM Rules/Calculators/A&P                         Abdominal pain with history of constipation.  Nausea.  Abdomen soft without peritoneal signs.  [facility] from [facility] on August 4 was negative for obstruction.  X-ray today shows no bowel obstruction.  Labs are reassuring with slightly low potassium.  Urinalysis from 2 days ago was negative.  Patient tolerating p.o.  Abdomen soft without peritoneal signs.  Discussed increasing MiraLAX at home to twice daily.  Increase fiber and water in the diet.  Early appendicitis considered less likely at this time.  Advised to return to the ED with worsening pain especially in the right lower quadrant, fever, vomiting, any other concerns. Final Clinical Impression(s) / ED Diagnoses Final diagnoses:  Generalized abdominal pain    Rx / DC Orders ED Discharge Orders    None       Desta Bujak, Jeannett Senior, MD 10/05/19 0710

## 2019-10-05 NOTE — Discharge Instructions (Signed)
Take MiraLAX twice daily until you are having regular bowel movements then once daily.  Increase water and fiber in your diet. Follow-up with your doctor. As we discussed, early appendicitis is possible but seems less likely at this time.  Return to the ED if you have abdominal pain on the right lower abdomen, fever, vomiting, or other concerns.

## 2019-11-21 ENCOUNTER — Other Ambulatory Visit: Payer: Self-pay

## 2019-11-21 ENCOUNTER — Emergency Department (HOSPITAL_COMMUNITY)
Admission: EM | Admit: 2019-11-21 | Discharge: 2019-11-21 | Disposition: A | Discharge status: Home / Self Care | Payer: HRSA Program | Attending: Emergency Medicine | Admitting: Emergency Medicine

## 2019-11-21 ENCOUNTER — Encounter (HOSPITAL_COMMUNITY): Payer: Self-pay | Admitting: *Deleted

## 2019-11-21 DIAGNOSIS — I10 Essential (primary) hypertension: Secondary | ICD-10-CM | POA: Diagnosis not present

## 2019-11-21 DIAGNOSIS — F17210 Nicotine dependence, cigarettes, uncomplicated: Secondary | ICD-10-CM | POA: Insufficient documentation

## 2019-11-21 DIAGNOSIS — R05 Cough: Secondary | ICD-10-CM | POA: Diagnosis present

## 2019-11-21 DIAGNOSIS — Z20822 Contact with and (suspected) exposure to covid-19: Secondary | ICD-10-CM | POA: Diagnosis not present

## 2019-11-21 DIAGNOSIS — J069 Acute upper respiratory infection, unspecified: Secondary | ICD-10-CM

## 2019-11-21 LAB — SARS CORONAVIRUS 2 BY RT PCR (HOSPITAL ORDER, PERFORMED IN ~~LOC~~ HOSPITAL LAB): SARS Coronavirus 2: NEGATIVE

## 2019-11-21 MED ORDER — BENZONATATE 100 MG PO CAPS: 100.0000 mg | ORAL_CAPSULE | Freq: Three times a day (TID) | ORAL | 0 refills | Status: AC

## 2019-11-21 NOTE — ED Notes (Signed)
Pt in bed, pt states that she would like to wait for her covid result, state that she needs something to show work so she can return .

## 2019-11-21 NOTE — ED Provider Notes (Addendum)
[facility] EMERGENCY DEPARTMENT Provider Note   CSN: [id number] Arrival date & time: 11/21/19  1238     History Chief Complaint  Patient presents with  . Cough    Hayley Buckley is a 54 y.o. female history of bipolar, depression, esophageal abnormality, hypertension, migraines, multiple personality disorder, thyroid disease.  Patient presents today requesting a Covid test.  Patient works for a temp service and reports that she had a mild nonproductive cough for the past week, she attributes this due to weather change, her husband is currently experiencing a similar cough though.    Patient reports 7-day history of mild nonproductive cough without shortness of breath chest pain or sputum production.  Patient reports 7 days ago she also had a mild sore throat which she described as a bilateral mild scratchy sensation nonradiating no aggravating or alleviating factors, this resolved over the course of 2 days.  Other associated symptom includes clear rhinorrhea x7 days.  No history of fever/chills, headache, vision changes, neck stiffness, hemoptysis, chest pain/shortness of breath, abdominal pain, nausea/vomiting, diarrhea, extremity swelling/color change, history of blood clot, ACE inhibitor use or any additional concerns.  Of note patient reports that she has received her first dose Covid vaccination, due to have second dose October 8.  HPI     Past Medical History:  Diagnosis Date  . Bipolar 1 disorder (HCC)   . Depression   . Esophageal abnormality   . Hypertension   . Insomnia   . Migraine   . Multiple personality disorder (HCC)   . Ovarian cyst   . Panic attack   . Thyroid disease     Patient Active Problem List   Diagnosis Date Noted  . Nasal congestion 04/22/2016  . Bipolar disorder, curr episode mixed, severe, with psychotic features (HCC) 04/21/2016  . Cannabis use disorder, moderate, dependence (HCC) 04/21/2016  . Bipolar 1 disorder, depressed (HCC)  04/20/2016    Past Surgical History:  Procedure Laterality Date  . CHOLECYSTECTOMY    . gastrectomy tube       OB History    Gravida      Para      Term      Preterm      AB      Living  0     SAB      TAB      Ectopic      Multiple      Live Births              Family History  Problem Relation Age of Onset  . Alzheimer's disease Mother   . Diabetes Mother   . Lung disease Father   . Bipolar disorder Father   . Heart attack Brother   . Diabetes Other   . Heart attack Other   . Cancer Other   . ALS Other     Social History   Tobacco Use  . Smoking status: Current Every Day Smoker    Packs/day: 0.50    Types: Cigarettes  . Smokeless tobacco: Never Used  Vaping Use  . Vaping Use: Never used  Substance Use Topics  . Alcohol use: Yes    Comment: social  . Drug use: No    Home Medications Prior to Admission medications   Medication Sig Start Date End Date Taking? Authorizing Provider  ARIPiprazole (ABILIFY) 5 MG tablet Take 1 tablet (5 mg total) by mouth at bedtime. 04/24/16   Rankin, Shuvon B, NP  benzonatate (TESSALON) 100 MG  capsule Take 1 capsule (100 mg total) by mouth every 8 (eight) hours. 11/21/19   Harlene Salts A, PA-C  benztropine (COGENTIN) 0.5 MG tablet Take 1 tablet (0.5 mg total) by mouth at bedtime. 04/24/16   Rankin, Shuvon B, NP  carbamazepine (TEGRETOL XR) 100 MG 12 hr tablet Take 1 tablet (100 mg total) by mouth 2 (two) times daily. 04/24/16   Rankin, Shuvon B, NP  diphenhydrAMINE (BENADRYL) 25 mg capsule Take 50 mg by mouth every 6 (six) hours as needed.    [provider]  escitalopram (LEXAPRO) 10 MG tablet Take 10 mg by mouth daily.    [provider]  fluticasone (FLONASE) 50 MCG/ACT nasal spray Place 2 sprays into both nostrils daily. 04/25/16   Rankin, Shuvon B, NP  hydrOXYzine (ATARAX/VISTARIL) 25 MG tablet Take 1 tablet (25 mg total) by mouth every 6 (six) hours. 02/01/17   Burgess Amor, PA-C  loratadine  (CLARITIN) 10 MG tablet Take 1 tablet (10 mg total) by mouth daily. Over the counter 04/25/16   Rankin, Shuvon B, NP  polyethylene glycol (MIRALAX) 17 g packet Take 17 g by mouth daily. 10/05/19   Rancour, Jeannett Senior, MD  predniSONE (DELTASONE) 20 MG tablet Take 2 tablets (40 mg total) by mouth daily. For 5 days 05/20/17   Pauline Aus, PA-C  traZODone (DESYREL) 50 MG tablet Take 1 tablet (50 mg total) by mouth at bedtime. 02/01/17   Burgess Amor, PA-C    Allergies    Patient has no known allergies.  Review of Systems   Review of Systems  Constitutional: Negative for chills and fever.  HENT: Positive for rhinorrhea and sore throat (Resolved). Negative for facial swelling, trouble swallowing and voice change.   Eyes: Negative.  Negative for visual disturbance.  Respiratory: Positive for cough. Negative for chest tightness and shortness of breath.   Cardiovascular: Negative.  Negative for chest pain and leg swelling.  Gastrointestinal: Negative.  Negative for abdominal pain, diarrhea, nausea and vomiting.    Physical Exam Updated Vital Signs BP 131/89 (BP Location: Right Arm)   Pulse 86   Temp 98.1 F (36.7 C) (Oral)   Resp 19   Ht 5\' 3"  (1.6 m)   Wt 72.6 kg   LMP 01/11/2017 (Approximate)   SpO2 98%   BMI 28.35 kg/m   Physical Exam Constitutional:      General: She is not in acute distress.    Appearance: Normal appearance. She is well-developed. She is not ill-appearing or diaphoretic.  HENT:     Head: Normocephalic and atraumatic.     Jaw: There is normal jaw occlusion. No trismus.     Right Ear: External ear normal.     Left Ear: External ear normal.     Nose: Rhinorrhea present. Rhinorrhea is clear.     Right Sinus: No maxillary sinus tenderness or frontal sinus tenderness.     Left Sinus: No maxillary sinus tenderness or frontal sinus tenderness.     Mouth/Throat:     Comments: Minimal postnasal drip.  The patient has normal phonation and is in control of secretions. No  stridor.  Midline uvula without edema. Soft palate rises symmetrically. No tonsillar erythema, swelling or exudates. Tongue protrusion is normal, floor of mouth is soft. No trismus. No creptius on neck palpation. No gingival erythema or fluctuance noted. Mucus membranes moist. No pallor noted. Eyes:     General: Vision grossly intact. Gaze aligned appropriately.     Conjunctiva/sclera: Conjunctivae normal.  Pupils: Pupils are equal, round, and reactive to light.  Neck:     Trachea: Trachea and phonation normal. No tracheal tenderness or tracheal deviation.     Meningeal: Brudzinski's sign absent.  Cardiovascular:     Rate and Rhythm: Normal rate and regular rhythm.  Pulmonary:     Effort: Pulmonary effort is normal. No accessory muscle usage or respiratory distress.     Breath sounds: Normal breath sounds and air entry.  Abdominal:     General: There is no distension.     Palpations: Abdomen is soft.     Tenderness: There is no abdominal tenderness. There is no guarding or rebound.  Musculoskeletal:        General: Normal range of motion.     Cervical back: Normal range of motion and neck supple. No edema or rigidity.     Right lower leg: No edema.     Left lower leg: No edema.  Skin:    General: Skin is warm and dry.  Neurological:     Mental Status: She is alert.     GCS: GCS eye subscore is 4. GCS verbal subscore is 5. GCS motor subscore is 6.     Comments: Speech is clear and goal oriented, follows commands Major Cranial nerves without deficit, no facial droop Moves extremities without ataxia, coordination intact  Psychiatric:        Behavior: Behavior normal.     ED Results / Procedures / Treatments   Labs (all labs ordered are listed, but only abnormal results are displayed) Labs Reviewed  SARS CORONAVIRUS 2 BY RT PCR (HOSPITAL ORDER, PERFORMED IN [facility])    EKG None  Radiology No results found.  Procedures Procedures (including critical  care time)  Medications Ordered in ED Medications - No data to display  ED Course  I have reviewed the triage vital signs and the nursing notes.  Pertinent labs & imaging results that were available during my care of the patient were reviewed by me and considered in my medical decision making (see chart for details).    MDM Rules/Calculators/A&P                         Additional history obtained from: 1. Nursing notes from this visit. ----------------------------------------- 54 year old female history as above presented requesting a Covid test.  She reports 7 days ago she had a mild sore throat which has since resolved.  She has had mild clear rhinorrhea and a nonproductive cough for 7 days without associated chest pain or shortness of breath.  She reports she is feeling well, she is requesting Covid test and discharge.  Vital signs reviewed, pulse rate of 104 documented in triage, on my exam she has no tachycardia.  Cranial nerves intact, no meningeal signs.  Airway shows some mild postnasal drip without evidence of PTA, RPA, Ludwick's, tonsillitis or other deep space infections.  She has mild rhinorrhea without evidence of sinusitis.  Cardiopulmonary exam within normal limits.  Abdomen soft nontender without peritoneal signs.  Neurovascular tact all 4 extremities no evidence of DVT.  Patient appears to be experiencing a mild URI with cough, possible COVID-19 viral infection given patient was only had 1 dose of Covid vaccine.  Additionally could be allergies with postnasal drip causing cough. Offered patient chest x-ray, she refused, based on history and physical I have low suspicion for pneumonia at this time.  Additionally doubt pulmonary embolism, PTX, ACE inhibitor or  other emergent pathologies of cough.  There is no indication for antibiotics blood work or further work-up in the ER.  She was informed to follow-up on her Covid test on her MyChart account and discuss those results with her  primary care provider at follow-up visit.  Work note was given to return at 11 days out from onset of illness.  Quarantine precautions discussed.  At this time there does not appear to be any evidence of an acute emergency medical condition and the patient appears stable for discharge with appropriate outpatient follow up. Diagnosis was discussed with patient who verbalizes understanding of care plan and is agreeable to discharge. I have discussed return precautions with patient who verbalizes understanding. Patient encouraged to follow-up with their PCP. All questions answered.   Hayley Buckley was evaluated in Emergency Department on 11/21/2019 for the symptoms described in the history of present illness. She was evaluated in the context of the global COVID-19 pandemic, which necessitated consideration that the patient might be at risk for infection with the SARS-CoV-2 virus that causes COVID-19. Institutional protocols and algorithms that pertain to the evaluation of patients at risk for COVID-19 are in a state of rapid change based on information released by regulatory bodies including the CDC and federal and state organizations. These policies and algorithms were followed during the patient's care in the ED. ------------------ Addendum, patient requested to wait in the ER for test results.  Due to low volume this was allowed by nursing staff.  Patient's Covid test was negative.  Suspect patient with unspecified viral URI versus allergies.  AVS printed and given to patient.  Note: Portions of this report may have been transcribed using voice recognition software. Every effort was made to ensure accuracy; however, inadvertent computerized transcription errors may still be present. Final Clinical Impression(s) / ED Diagnoses Final diagnoses:  Viral URI with cough  Lab test negative for COVID-19 virus    Rx / DC Orders ED Discharge Orders         Ordered    benzonatate (TESSALON) 100 MG capsule   Every 8 hours        11/21/19 1351           Elizabeth PalauMorelli, Irma Roulhac A, PA-C 11/21/19 1405    Sabas SousBero, Michael M, MD 11/21/19 1507    Bill SalinasMorelli, Rozalyn Osland A, PA-C 11/21/19 1534    Bill SalinasMorelli, Sarrah Fiorenza A, PA-C 11/21/19 1539    Sabas SousBero, Michael M, MD 11/24/19 [id number]

## 2019-11-21 NOTE — Discharge Instructions (Addendum)
At this time there does not appear to be the presence of an emergent medical condition, however there is always the potential for conditions to change. Please read and follow the below instructions.  Please return to the Emergency Department immediately for any new or worsening symptoms. Please be sure to follow up with your Primary Care Provider within one week regarding your visit today; please call their office to schedule an appointment even if you are feeling better for a follow-up visit. Your Covid test was negative today.  Please follow-up with your primary care doctor for recheck. You may use the [employer] as prescribed to help with your cough.  Please drink plenty of water to avoid dehydration.  Please get plenty of rest.  Get help right away if: You have trouble breathing. You have fever or chills You have abdominal pain or vomiting. You have pain or pressure in your chest. You have confusion. You have bluish lips and fingernails. You have difficulty waking from sleep. You have very bad or constant: Headache. Ear pain. Pain in your forehead, behind your eyes, and over your cheekbones (sinus pain). Chest pain. You have long-lasting (chronic) lung disease along with any of these: Wheezing. Long-lasting cough. Coughing up blood. A change in your usual mucus. You have a stiff neck. You have changes in your: Vision. Hearing. Thinking. Mood. You have any new/concerning or worsening of symptoms These symptoms may represent a serious problem that is an emergency. Do not wait to see if the symptoms will go away. Get medical help right away. Call your local emergency services (911 in the [place] Do not drive yourself to the hospital. Let the emergency medical personnel know if you think you have COVID-19.  Please read the additional information packets attached to your discharge summary.  Do not take your medicine if  develop an itchy rash, swelling in your mouth or lips, or  difficulty breathing; call 911 and seek immediate emergency medical attention if this occurs.  You may review your lab tests and imaging results in their entirety on your MyChart account.  Please discuss all results of fully with your primary care provider and other specialist at your follow-up visit.  Note: Portions of this text may have been transcribed using voice recognition software. Every effort was made to ensure accuracy; however, inadvertent computerized transcription errors may still be present.

## 2019-11-21 NOTE — ED Triage Notes (Signed)
Pt c/o cough and her job made her come here for covid test

## 2021-08-16 ENCOUNTER — Emergency Department (HOSPITAL_COMMUNITY)
Admission: EM | Admit: 2021-08-16 | Discharge: 2021-08-16 | Discharge status: Against Medical Advice | Payer: Medicaid Other | Attending: Emergency Medicine | Admitting: Emergency Medicine

## 2021-08-16 ENCOUNTER — Other Ambulatory Visit: Payer: Self-pay

## 2021-08-16 ENCOUNTER — Encounter (HOSPITAL_COMMUNITY): Payer: Self-pay

## 2021-08-16 DIAGNOSIS — T7411XA Adult physical abuse, confirmed, initial encounter: Secondary | ICD-10-CM | POA: Insufficient documentation

## 2021-08-16 DIAGNOSIS — Z5321 Procedure and treatment not carried out due to patient leaving prior to being seen by health care provider: Secondary | ICD-10-CM | POA: Insufficient documentation

## 2021-08-16 DIAGNOSIS — R519 Headache, unspecified: Secondary | ICD-10-CM | POA: Insufficient documentation

## 2021-08-16 DIAGNOSIS — H5371 Glare sensitivity: Secondary | ICD-10-CM | POA: Insufficient documentation
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
## 2021-08-16 NOTE — ED Notes (Signed)
Pt requested to leave AMA.
